# Patient Record
Sex: Female | Born: 1985 | Race: White | Hispanic: No | Marital: Married | Smoking: Current every day smoker
Health system: Southern US, Community
[De-identification: ages and names within clinical notes are randomized; demographics above are authoritative.]

## PROBLEM LIST (undated history)

## (undated) DIAGNOSIS — K219 Gastro-esophageal reflux disease without esophagitis: Secondary | ICD-10-CM

## (undated) DIAGNOSIS — M5136 Other intervertebral disc degeneration, lumbar region: Secondary | ICD-10-CM

## (undated) DIAGNOSIS — K851 Biliary acute pancreatitis without necrosis or infection: Secondary | ICD-10-CM

## (undated) DIAGNOSIS — M51369 Other intervertebral disc degeneration, lumbar region without mention of lumbar back pain or lower extremity pain: Secondary | ICD-10-CM

## (undated) HISTORY — DX: Other intervertebral disc degeneration, lumbar region: M51.36

## (undated) HISTORY — PX: TUBAL LIGATION: SHX77

## (undated) HISTORY — DX: Biliary acute pancreatitis without necrosis or infection: K85.10

## (undated) HISTORY — DX: Other intervertebral disc degeneration, lumbar region without mention of lumbar back pain or lower extremity pain: M51.369

---
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
## 2008-02-07 ENCOUNTER — Inpatient Hospital Stay (HOSPITAL_COMMUNITY): Admission: AD | Admit: 2008-02-07 | Discharge: 2008-02-07 | Payer: Self-pay | Admitting: Obstetrics and Gynecology

## 2008-05-21 ENCOUNTER — Inpatient Hospital Stay (HOSPITAL_COMMUNITY): Admission: AD | Admit: 2008-05-21 | Discharge: 2008-05-21 | Payer: Self-pay | Admitting: Obstetrics and Gynecology

## 2008-07-20 ENCOUNTER — Inpatient Hospital Stay (HOSPITAL_COMMUNITY): Admission: AD | Admit: 2008-07-20 | Discharge: 2008-07-20 | Payer: Self-pay | Admitting: Obstetrics & Gynecology

## 2008-07-21 ENCOUNTER — Inpatient Hospital Stay (HOSPITAL_COMMUNITY): Admission: AD | Admit: 2008-07-21 | Discharge: 2008-07-21 | Payer: Self-pay | Admitting: Obstetrics and Gynecology

## 2008-08-04 ENCOUNTER — Inpatient Hospital Stay (HOSPITAL_COMMUNITY): Admission: AD | Admit: 2008-08-04 | Discharge: 2008-08-04 | Payer: Self-pay | Admitting: Obstetrics and Gynecology

## 2008-08-18 ENCOUNTER — Inpatient Hospital Stay (HOSPITAL_COMMUNITY): Admission: AD | Admit: 2008-08-18 | Discharge: 2008-08-21 | Payer: Self-pay | Admitting: Obstetrics and Gynecology

## 2008-09-15 HISTORY — PX: TUBAL LIGATION: SHX77

## 2008-09-17 ENCOUNTER — Emergency Department (HOSPITAL_COMMUNITY): Admission: EM | Admit: 2008-09-17 | Discharge: 2008-09-17 | Payer: Self-pay | Admitting: Internal Medicine

## 2008-11-20 ENCOUNTER — Emergency Department (HOSPITAL_COMMUNITY): Admission: EM | Admit: 2008-11-20 | Discharge: 2008-11-20 | Payer: Self-pay | Admitting: Emergency Medicine

## 2008-11-22 ENCOUNTER — Emergency Department (HOSPITAL_COMMUNITY): Admission: EM | Admit: 2008-11-22 | Discharge: 2008-11-22 | Payer: Self-pay | Admitting: Emergency Medicine

## 2009-08-27 ENCOUNTER — Emergency Department (HOSPITAL_COMMUNITY): Admission: EM | Admit: 2009-08-27 | Discharge: 2009-08-27 | Payer: Self-pay | Admitting: Emergency Medicine

## 2009-08-29 ENCOUNTER — Emergency Department (HOSPITAL_COMMUNITY): Admission: EM | Admit: 2009-08-29 | Discharge: 2009-08-29 | Payer: Self-pay | Admitting: Emergency Medicine

## 2009-10-26 ENCOUNTER — Encounter: Admission: RE | Admit: 2009-10-26 | Discharge: 2009-10-26 | Payer: Self-pay | Admitting: Family Medicine

## 2010-02-01 ENCOUNTER — Emergency Department (HOSPITAL_COMMUNITY): Admission: EM | Admit: 2010-02-01 | Discharge: 2010-02-01 | Payer: Self-pay | Admitting: Emergency Medicine

## 2010-02-03 ENCOUNTER — Emergency Department (HOSPITAL_COMMUNITY): Admission: EM | Admit: 2010-02-03 | Discharge: 2010-02-03 | Payer: Self-pay | Admitting: Emergency Medicine

## 2010-03-08 ENCOUNTER — Ambulatory Visit (HOSPITAL_COMMUNITY): Admission: RE | Admit: 2010-03-08 | Discharge: 2010-03-08 | Payer: Self-pay | Admitting: Obstetrics and Gynecology

## 2010-04-19 ENCOUNTER — Encounter: Admission: RE | Admit: 2010-04-19 | Discharge: 2010-04-19 | Payer: Self-pay | Admitting: Obstetrics and Gynecology

## 2010-04-26 ENCOUNTER — Encounter: Admission: RE | Admit: 2010-04-26 | Discharge: 2010-04-26 | Payer: Self-pay | Admitting: Obstetrics and Gynecology

## 2010-05-17 ENCOUNTER — Ambulatory Visit (HOSPITAL_COMMUNITY): Admission: RE | Admit: 2010-05-17 | Discharge: 2010-05-17 | Payer: Self-pay | Admitting: Gastroenterology

## 2010-09-26 ENCOUNTER — Emergency Department (HOSPITAL_COMMUNITY)
Admission: EM | Admit: 2010-09-26 | Discharge: 2010-09-27 | Payer: Self-pay | Source: Home / Self Care | Admitting: Emergency Medicine

## 2010-09-30 LAB — COMPREHENSIVE METABOLIC PANEL
ALT: 13 U/L (ref 0–35)
AST: 17 U/L (ref 0–37)
Albumin: 4.3 g/dL (ref 3.5–5.2)
Alkaline Phosphatase: 67 U/L (ref 39–117)
BUN: 3 mg/dL — ABNORMAL LOW (ref 6–23)
CO2: 24 mEq/L (ref 19–32)
Calcium: 9.4 mg/dL (ref 8.4–10.5)
Chloride: 106 mEq/L (ref 96–112)
Creatinine, Ser: 0.69 mg/dL (ref 0.4–1.2)
GFR calc Af Amer: >60 mL/min (ref >60)
GFR calc non Af Amer: >60 mL/min (ref >60)
Glucose, Bld: 93 mg/dL (ref 70–99)
Potassium: 3.7 mEq/L (ref 3.5–5.1)
Sodium: 138 mEq/L (ref 135–145)
Total Bilirubin: 0.6 mg/dL (ref 0.3–1.2)
Total Protein: 7 g/dL (ref 6.0–8.3)

## 2010-09-30 LAB — URINALYSIS, ROUTINE W REFLEX MICROSCOPIC
Bilirubin Urine: NEGATIVE
Ketones, ur: NEGATIVE mg/dL
Nitrite: NEGATIVE
Protein, ur: NEGATIVE mg/dL
Specific Gravity, Urine: 1.009 (ref 1.005–1.030)
Urine Glucose, Fasting: NEGATIVE mg/dL
Urobilinogen, UA: 0.2 mg/dL (ref 0.0–1.0)
pH: 7 (ref 5.0–8.0)

## 2010-09-30 LAB — URINE MICROSCOPIC-ADD ON

## 2010-09-30 LAB — CBC
HCT: 39 % (ref 36.0–46.0)
Hemoglobin: 13.5 g/dL (ref 12.0–15.0)
MCH: 30 pg (ref 26.0–34.0)
MCHC: 34.6 g/dL (ref 30.0–36.0)
MCV: 86.7 fL (ref 78.0–100.0)
Platelets: 247 10*3/uL (ref 150–400)
RBC: 4.5 MIL/uL (ref 3.87–5.11)
RDW: 12.4 % (ref 11.5–15.5)
WBC: 6.2 10*3/uL (ref 4.0–10.5)

## 2010-09-30 LAB — DIFFERENTIAL
Basophils Absolute: 0 10*3/uL (ref 0.0–0.1)
Basophils Relative: 0 % (ref 0–1)
Eosinophils Absolute: 0.2 10*3/uL (ref 0.0–0.7)
Eosinophils Relative: 3 % (ref 0–5)
Lymphocytes Relative: 37 % (ref 12–46)
Lymphs Abs: 2.3 10*3/uL (ref 0.7–4.0)
Monocytes Absolute: 0.4 10*3/uL (ref 0.1–1.0)
Monocytes Relative: 7 % (ref 3–12)
Neutro Abs: 3.4 10*3/uL (ref 1.7–7.7)
Neutrophils Relative %: 54 % (ref 43–77)

## 2010-09-30 LAB — POCT PREGNANCY, URINE: Preg Test, Ur: NEGATIVE

## 2010-09-30 LAB — LIPASE, BLOOD: Lipase: 22 U/L (ref 11–59)

## 2010-12-01 LAB — HCG, QUANTITATIVE, PREGNANCY: hCG, Beta Chain, Quant, S: <2 m[IU]/mL (ref <5)

## 2010-12-01 LAB — CBC
HCT: 40.2 % (ref 36.0–46.0)
Hemoglobin: 13.9 g/dL (ref 12.0–15.0)
MCH: 30.9 pg (ref 26.0–34.0)
MCHC: 34.6 g/dL (ref 30.0–36.0)
MCV: 89.2 fL (ref 78.0–100.0)
Platelets: 221 10*3/uL (ref 150–400)
RBC: 4.51 MIL/uL (ref 3.87–5.11)
RDW: 13 % (ref 11.5–15.5)
WBC: 5.4 10*3/uL (ref 4.0–10.5)

## 2010-12-01 LAB — SURGICAL PCR SCREEN
MRSA, PCR: NEGATIVE
Staphylococcus aureus: POSITIVE — AB

## 2010-12-02 LAB — BASIC METABOLIC PANEL
BUN: 4 mg/dL — ABNORMAL LOW (ref 6–23)
CO2: 26 mEq/L (ref 19–32)
Calcium: 9.4 mg/dL (ref 8.4–10.5)
Chloride: 105 mEq/L (ref 96–112)
Creatinine, Ser: 0.66 mg/dL (ref 0.4–1.2)
GFR calc Af Amer: >60 mL/min (ref >60)
GFR calc non Af Amer: >60 mL/min (ref >60)
Glucose, Bld: 89 mg/dL (ref 70–99)
Potassium: 3.5 mEq/L (ref 3.5–5.1)
Sodium: 139 mEq/L (ref 135–145)

## 2010-12-02 LAB — DIFFERENTIAL
Basophils Absolute: 0 10*3/uL (ref 0.0–0.1)
Lymphocytes Relative: 18 % (ref 12–46)
Monocytes Absolute: 0.7 10*3/uL (ref 0.1–1.0)
Monocytes Relative: 7 % (ref 3–12)
Neutro Abs: 7.7 10*3/uL (ref 1.7–7.7)
Neutrophils Relative %: 74 % (ref 43–77)

## 2010-12-02 LAB — CBC
Hemoglobin: 14.4 g/dL (ref 12.0–15.0)
RBC: 4.66 MIL/uL (ref 3.87–5.11)
RDW: 12.4 % (ref 11.5–15.5)

## 2010-12-26 LAB — CULTURE, ROUTINE-ABSCESS: Gram Stain: NONE SEEN

## 2010-12-30 LAB — COMPREHENSIVE METABOLIC PANEL
ALT: 36 U/L — ABNORMAL HIGH (ref 0–35)
AST: 81 U/L — ABNORMAL HIGH (ref 0–37)
Alkaline Phosphatase: 121 U/L — ABNORMAL HIGH (ref 39–117)
CO2: 24 mEq/L (ref 19–32)
Calcium: 8.9 mg/dL (ref 8.4–10.5)
Chloride: 106 mEq/L (ref 96–112)
GFR calc Af Amer: >60 mL/min (ref >60)
GFR calc non Af Amer: >60 mL/min (ref >60)
Glucose, Bld: 119 mg/dL — ABNORMAL HIGH (ref 70–99)
Potassium: 3.5 mEq/L (ref 3.5–5.1)
Sodium: 138 mEq/L (ref 135–145)

## 2010-12-30 LAB — DIFFERENTIAL
Basophils Relative: 1 % (ref 0–1)
Eosinophils Absolute: 0.2 10*3/uL (ref 0.0–0.7)
Eosinophils Relative: 1 % (ref 0–5)
Lymphs Abs: 2.6 10*3/uL (ref 0.7–4.0)
Neutrophils Relative %: 74 % (ref 43–77)

## 2010-12-30 LAB — URINALYSIS, ROUTINE W REFLEX MICROSCOPIC
Bilirubin Urine: NEGATIVE
Hgb urine dipstick: NEGATIVE
Ketones, ur: NEGATIVE mg/dL
Nitrite: NEGATIVE
Specific Gravity, Urine: 1.015 (ref 1.005–1.030)
pH: 7 (ref 5.0–8.0)

## 2010-12-30 LAB — URINE MICROSCOPIC-ADD ON

## 2010-12-30 LAB — CBC
Hemoglobin: 12.4 g/dL (ref 12.0–15.0)
MCHC: 33.5 g/dL (ref 30.0–36.0)
RBC: 4.12 MIL/uL (ref 3.87–5.11)
WBC: 14.8 10*3/uL — ABNORMAL HIGH (ref 4.0–10.5)

## 2010-12-30 LAB — LIPASE, BLOOD: Lipase: 39 U/L (ref 11–59)

## 2011-01-23 ENCOUNTER — Emergency Department (HOSPITAL_COMMUNITY)
Admission: EM | Admit: 2011-01-23 | Discharge: 2011-01-24 | Disposition: A | Discharge status: Home / Self Care | Payer: Self-pay | Attending: Emergency Medicine | Admitting: Emergency Medicine

## 2011-01-23 ENCOUNTER — Emergency Department (HOSPITAL_COMMUNITY): Payer: Self-pay

## 2011-01-23 DIAGNOSIS — R5381 Other malaise: Secondary | ICD-10-CM | POA: Insufficient documentation

## 2011-01-23 DIAGNOSIS — R1031 Right lower quadrant pain: Secondary | ICD-10-CM | POA: Insufficient documentation

## 2011-01-23 DIAGNOSIS — R42 Dizziness and giddiness: Secondary | ICD-10-CM | POA: Insufficient documentation

## 2011-01-23 DIAGNOSIS — R079 Chest pain, unspecified: Secondary | ICD-10-CM | POA: Insufficient documentation

## 2011-01-23 DIAGNOSIS — R300 Dysuria: Secondary | ICD-10-CM | POA: Insufficient documentation

## 2011-01-23 DIAGNOSIS — R5383 Other fatigue: Secondary | ICD-10-CM | POA: Insufficient documentation

## 2011-01-23 LAB — URINALYSIS, ROUTINE W REFLEX MICROSCOPIC
Bilirubin Urine: NEGATIVE
Hgb urine dipstick: NEGATIVE
Ketones, ur: NEGATIVE mg/dL
Specific Gravity, Urine: 1.009 (ref 1.005–1.030)
Urobilinogen, UA: 0.2 mg/dL (ref 0.0–1.0)
pH: 7 (ref 5.0–8.0)

## 2011-01-23 LAB — CBC
HCT: 39.7 % (ref 36.0–46.0)
Hemoglobin: 13.7 g/dL (ref 12.0–15.0)
MCH: 29.5 pg (ref 26.0–34.0)
MCHC: 34.5 g/dL (ref 30.0–36.0)
RDW: 12.4 % (ref 11.5–15.5)

## 2011-01-23 LAB — DIFFERENTIAL
Eosinophils Relative: 1 % (ref 0–5)
Lymphocytes Relative: 35 % (ref 12–46)
Monocytes Absolute: 0.5 10*3/uL (ref 0.1–1.0)
Monocytes Relative: 8 % (ref 3–12)
Neutro Abs: 3.4 10*3/uL (ref 1.7–7.7)

## 2011-01-23 LAB — POCT PREGNANCY, URINE: Preg Test, Ur: NEGATIVE

## 2011-01-24 LAB — BASIC METABOLIC PANEL
BUN: 5 mg/dL — ABNORMAL LOW (ref 6–23)
CO2: 25 mEq/L (ref 19–32)
Calcium: 9.4 mg/dL (ref 8.4–10.5)
Creatinine, Ser: 0.56 mg/dL (ref 0.4–1.2)
Glucose, Bld: 102 mg/dL — ABNORMAL HIGH (ref 70–99)

## 2011-01-28 NOTE — H&P (Signed)
NAME:  Molly Beard, Molly Beard NO.:  [id number]   MEDICAL RECORD NO.:  [id number]         PATIENT TYPE:  WINP   LOCATION:                                FACILITY:  [facility]   PHYSICIAN:  Osborn Coho, M.D.   DATE OF BIRTH:  1986-03-23   DATE OF ADMISSION:  08/18/2008  DATE OF DISCHARGE:                              HISTORY & PHYSICAL   Molly Beard is a 25 year old gravida 2, para 1-0-0-1 at 40-6/7 weeks who  presents tonight for induction with initial cervical ripening.  Her  pregnancy has been remarkable for:  1. First trimester spotting.  2. The patient is a smoker.  3. History of depression.  4. 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  5. Positive group B strep.   PRENATAL LABORATORY DATA:  Blood type is O+, Rh antibody negative, VDRL  nonreactive, rubella titer positive, hepatitis B surface antigen  negative, HIV is nonreactive.  Cystic fibrosis testing shows positive  carrier trait.  HIV is nonreactive.  Glucola is normal.  Pap was normal  in May.  GC and chlamydia cultures were negative in May.  Hemoglobin  upon entering the practice was 13.5.  It was 10.5 at 28 weeks.  The  patient had a first trimester screen ultrasound that was within normal  limits.  Group B strep culture was noted to be positive at 36 weeks.   HISTORY OF PRESENT PREGNANCY:  The patient entered care at approximately  10 weeks.  She had an ultrasound at that time that showed a viable  intrauterine pregnancy with an [facility] of August 14, 2008.  Her EDC by LMP  was August 12, 2008.  She did have some first trimester spotting.  She  did have first trimester screen that was normal.  She did have a history  of positive cystic fibrosis screen, but they declined partner treatment  secondary to cost.  She had some UTI symptoms and was seen in the  maternity admissions in May.  Another urine culture was sent.  The  patient was  placed on Bactrim.  She was also treated for BV.  She was  given a dental referral.  She completed that antibiotic by 18 weeks.  She had a normal ultrasound at that time with normal fluid.  She was  having a questionable ganglion cyst in the left arm.  UTI symptoms were  resolved by 22 weeks.  She had a Glucola that was normal.  Her iron  level was slightly low at 26 weeks at 10.5.  She was recommended to take  iron supplementation.  She was seen in maternity admissions unit at 36  weeks for nausea and vomiting secondary to questionable viral exposure.  She received some IV fluids and antiemetics.  She still was having  vomiting the next day.  She was sent back to maternity admissions unit  for fluid secondary to 2+ ketones.  She was treated for yeast at 38  weeks.  At 39 weeks, cervix was  1, 70% vertex, -2.  Positive beta strep  was noted.  She was seen on December 3 for a series of contractions.  However, these had subsided by the time she had came to the office, and  NST was overall reassuring but was not clearly reactive.  Cervix was 1,  posterior, 60%, vertex at -1 station.  A BPP was done showing an 8/8  score with fluid and AFI of 7.54 cm at the 10th percentile and vertex  presentation.  Dr. Mikey Bussing was consulted.  The decision was made that  the patient did not require admission on that day, but the plan was made  to admit her for cervical ripening and induction at the next available  slot.  This was then able to be accomplished on the evening of December  4 at 7:30.   OBSTETRICAL HISTORY:  In 2006, she had a vaginal birth of a female infant,  weight 5 pounds 8 ounces at 40 weeks.  She was in labor 21 hours.  She  had epidural anesthesia.  She did have beta strep with that pregnancy as  well.   MEDICAL HISTORY:  She has been a smoker since age 25, approximately half  a pack per day.  Her only other hospitalization was for childbirth.  She  has no surgical history.   She has no  medication allergies.   FAMILY HISTORY:  Her mother and sister have hypertension.  Her paternal  grandfather had lung cancer.  Maternal grandfather had some unknown type  of cancer.  Her mother, sister and father all smoked.   GENETIC HISTORY:  __________   SOCIAL HISTORY:  The patient is single.  The father of baby is involved  and supportive.  His name is Chandra Batch.  The patient has an 8th grade  education.  She is a Futures trader.  Her partner has an 11th grade  education.  He is a Administrator.  She has been followed by the certified  nurse midwife service at [facility].  She denies any alcohol,  drug or tobacco use during this pregnancy.   PHYSICAL EXAMINATION:  VITAL SIGNS:  Stable.  The patient is febrile.  HEENT:  Within normal limits.  LUNGS:  Breath sounds are clear.  HEART:  Regular rate and rhythm without murmur.  BREASTS:  Soft and nontender.  ABDOMEN:  Fundal height is approximately 38 cm.  Estimated fetal weight  is 7 to 7-1/2 pounds.  Uterine contractions are very occasional and  mild.  Fetal heart rate is reassuring.  BPP shows an 8/8 with an AFI of  7.54 cm which is the 10th percentile.  Cervix is posterior, 1 cm, 60%,  vertex at a -1 station.  Membranes were attempted to be swept, but this  was unable to be accomplished without significant pain on the patient's  part.  EXTREMITIES:  Deep tendon reflexes are 2+ without clonus.  There is  trace edema noted.   IMPRESSION:  1. Intrauterine pregnancy at 40-6/7 weeks.  2. Unfavorable cervix.  3. Positive group B strep.   PLAN:  1. Admit to birthing suite per consult with Dr. Osborn Coho as      attending physician.  2. Routine certified nurse midwife orders.  3. Plan cervical ripening anticipated with Cytotec this evening and      then Pitocin in the morning.  4. Will initiate group B strep prophylaxis with penicillin G per      standard dosing once Pitocin is begun  or once labor ensues.  5. Pain  medication p.r.n.      Molly Beard, C.N.M.      Osborn Coho, M.D.  Electronically Signed    VLL/MEDQ  D:  08/18/2008  T:  08/18/2008  Job:  [id number]

## 2011-01-31 NOTE — H&P (Signed)
[facility]  Patient:    Molly Beard, NISHIDA Visit Number: [id number] MRN: [id number]          Service Type: PSY Location: 100 0103 02 Attending Physician:  Veneta Penton Dictated by:   Jasmine Pang, M.D. Admit Date:  07/16/2001   CC:         [facility].   Psychiatric Admission Assessment  DATE OF ADMISSION:  July 17, 2001.  IDENTIFICATION:  A 25 year old Caucasian female from [facility] referred by the [facility] due to suicidal ideation.  HISTORY OF PRESENT ILLNESS:  The patient was referred due to suicidal ideation with auditory command hallucinations to harm herself.  She reports conflict with her mother revolving around the fact that she was not going to school. Mother apparently called the police to have her committed due to her escalating compensation and auditory hallucinations.  She admits to experiencing depressed, irritability, anergia, anhedonia, and difficulty concentrating.  She is also experiencing insomnia and feelings of hopelessness and worthlessness.  She has had auditory hallucinations for the past year which command her to kill herself.  She tries to block these hallucinations out but states recently they have begun to interfere with her functioning, including going to school.  She admits to being very frightened by them.  The patient was admitted to the hospital due to dangerousness to self and need for a more controlled environment.  PAST PSYCHIATRIC HISTORY:  None.  SUBSTANCE ABUSE HISTORY:  None.  PAST MEDICAL HISTORY:  No acute health problems.  ALLERGIES:  No known drug allergies.  CURRENT MEDICATIONS:  None.  FAMILY/SOCIAL HISTORY:  The patient lives with her mother, brother, and sister. She admits to avoiding school recently, resulting in a drop in her academic performance.  She denies any history of physical or sexual abuse.  FAMILY PSYCHIATRIC  HISTORY:  Positive for her sister suffering from depression.  Her sister was hospitalized at [facility].  The patient has no legal problems.  MENTAL STATUS EXAMINATION:  The patient presented as a reserved but cooperative Caucasian female with poor eye contact.  She has psychomotor retardation.  Speech was soft and slow.  There was no articulation disorder. Mood depressed, affect sad, constricted.  Positive suicidal ideation  as per history of present illness.  She is able to contract for safety in the hospital.  There is no homicidal ideation.  Positive psychosis with auditory hallucinations telling to harm herself, as per history of present illness. She denies active hallucinations during this exam.  She denies visual hallucinations.  Thought processes were logical and goal directed.  Thought content:  No predominant theme.  On cognitive exam, patient was alert and oriented x 4.  Short term and long term memory were adequate.  General fund of knowledge were age and education level appropriate.  Attention and concentration poor, insight poor, judgment poor.  ADMISSION DIAGNOSES: Axis I:    1. Depressive disorder not otherwise specified.            2. Psychotic disorder not otherwise specified. Axis II:   Deferred. Axis III:  Healthy. Axis IV:   Severe. Axis V:    Global assessment of function is 20.  PROBLEMS:  Mood instability with psychosis with auditory hallucinations commanding threats to harm self.  SHORT TERM TREATMENT GOAL:  Resolution of psychosis and threats to harm self.  LONG TERM TREATMENT GOAL:  Resolution of mood instability and psychotic processes.  INITIAL PLAN OF CARE:  Begin Geodon 20 mg p.o. b.i.d. and Effexor XR 37.5 mg p.o. q.a.m.  The patient will be involved in unit therapeutic groups and activities.  The patient will be involved in family therapy.  ESTIMATED LENGTH OF INPATIENT TREATMENT:  Five to seven days.  CONDITION  NECESSARY FOR DISCHARGE:  Less depressed, not suicidal, resolution of hallucinations.  POST HOSPITAL CARE PLAN:  Return home to live with family.  Followup therapy and medication management will be at the [facility]. Dictated by:   Jasmine Pang, M.D. Attending Physician:  Veneta Penton DD:  07/17/01 TD:  07/19/01 Job: [id number]

## 2011-01-31 NOTE — Discharge Summary (Signed)
[facility]  Patient:    Molly Beard, Molly Beard Visit Number: [id number] MRN: [id number]          Service Type: PSY Location: 100 0103 02 Attending Physician:  Veneta Penton Dictated by:   Carolanne Grumbling, M.D. Admit Date:  07/16/2001 Discharge Date: 07/26/2001                             Discharge Summary  INTRODUCTION:  Augusto Gamble was a 25 year old female.  INITIAL ASSESSMENT AND DIAGNOSES:  Augusto Gamble was admitted to the service of Dr. Haynes Hoehn, who was her attending, until the weekend of discharge, when I was on-call.  Augusto Gamble was admitted because of suicidal ideation.  She, reportedly, also had auditory hallucinations telling her to harm herself.  She reported conflict with her mother around school issues and not attending.  She admitted to depression with the various symptoms supporting that diagnosis.  MENTAL STATUS:  At the time of the initial evaluation revealed an alert, oriented young woman, who was basically cooperative.  She was quiet and speech was soft and slow.  She appeared depressed and sad with constricted affect. She was positive for suicidal ideation per history.  She was able to contract for safety at the time of admission.  She did admit to auditory hallucinations telling her to harm herself and others at times.  She denied any hallucinations at the time of the mental status.  There were no other problems to suggest psychosis.  Short and long-term memory were intact.  Insight was poor.  Judgment was poor.  Concentration was poor.  Other pertinent history can be obtained from the psychosocial service summary.  PHYSICAL EXAMINATION:  Essentially normal.  ADMITTING DIAGNOSES: Axis I:    1. Depressive disorder not otherwise specified.            2. Psychotic disorder not otherwise specified. Axis II:   Deferred. Axis III:  Healthy. Axis IV:   Severe. Axis V:    20.  LABORATORY DATA:  Noncontributory to the situation.  HOSPITAL COURSE:   While in the hospital, Jody initially was unhappy in being here, believed she did not need to be in the hospital, bargained with her mother to get her out and, at some point in the first few days, she seemed to accept the idea that she had to be here and she began to do some talking and some work in groups.  She also met with her mother every day while she was here.  The initial plan was that she would go live with her father.  Mother was not thrilled with the idea but said it simply was not working out at home and she had to live somewhere else.  At some point during the hospitalization, Jody and her father had words.  Jody decided that she could not live with the father.  She said her father and stepmother have their own set of problems. Her stepmother was also a drinker and that was a problem.  It appeared they had disagreements over Jody coming to live with them and that caused a breakup in the marriage.  Jody said that was not uncommon but, nevertheless, she said, based on all the strife there, it made little sense to live with her father. She believes she could put up with her mothers problems more than she could the problems she would have dealing with her father and stepmother. Consequently, her father apparently changed  his mind and said he would not pick her up at discharge nor take her after discharge.  Mother continued to be involved and said she would continue to work with her.  By this point, Augusto Gamble was not suicidal.  She was feeling much better about herself, she said.  She was feeling that she could find other ways of dealing with her anger and irritation.  She and her mother believe they could work together to improve things somewhat.  She seemed at least somewhat willing to go to school. Consequently, all the issues that were problematic at least had an answer at the time of discharge.  Because of her continued denial of suicidal ideation, she was discharged home to her  mother.  DISCHARGE MEDICATIONS: 1. Effexor XR 75 mg daily. 2. Geodon 120 mg at bedtime.  FINAL DIAGNOSES: Axis I:    1. Major depressive disorder, recurrent, severe with psychotic               features.            2. Oppositional defiant disorder. Axis II:   Deferred. Axis III:  Healthy. Axis IV:   Severe. Axis V:    50. Dictated by:   Carolanne Grumbling, M.D. Attending Physician:  Veneta Penton DD:  07/29/01 TD:  07/29/01 Job: [id number]

## 2011-01-31 NOTE — H&P (Signed)
NAME:  Molly Beard, EHLER                 ACCOUNT NO.:  [id number]   MEDICAL RECORD NO.:  [id number]          PATIENT TYPE:  INP   LOCATION:  9167                          FACILITY:  [facility]   PHYSICIAN:  Crist Fat. Rivard, M.D. DATE OF BIRTH:  Jun 13, 1986   DATE OF ADMISSION:  01/24/2005  DATE OF DISCHARGE:                                HISTORY & PHYSICAL   Molly Beard is a 25 year old, single, white female, gravida 1, para 0, at  15.5 weeks' gestation who presents complaining of uterine contractions every  2-3 minutes, all late afternoon and early evening.  She denies any leaking  or vaginal bleeding.  She is very uncomfortable with the pressure of the  contractions.  She denies any nausea, vomiting, headaches, or visual  disturbances.   Her pregnancy has been followed at [facility]  by the certified  nurse midwife service and has been essentially uncomplicated though at risk  for:  1.  Being an adolescent.  2.  Questionable LMP.  3.  Smoker.  4.  Positive Group B strep.   She desires epidural for labor.   OBSTETRICAL/GYNECOLOGIC HISTORY:  1.  She is a gravida 1, para 0, with a questionable LMP and EDC established      by early ultrasound of Jan 29, 2005.  2.  She has no other GYN history.   GENERAL MEDICAL HISTORY:  1.  She has no known drug allergies.  2.  She reports having had the usual childhood diseases.  3.  She has no other medical issues.   FAMILY HISTORY:  Significant only for maternal grandfather with lung cancer.   GENETIC HISTORY:  Negative.   SOCIAL HISTORY:  She is single.  She lives with her mother.  The father of  the baby and the mother of the father of the baby are very involved and  supportive.  She denies any religious affiliation that would affect her  care.  She is currently unemployed.   PRENATAL LABS:  Her blood type is O positive.  Her antibody screen is  negative.  Syphilis is nonreactive.  Rubella is positive.  Hepatitis B  surface  antigen is negative.  HIV is nonreactive.  GC and Chlamydia and Pap  are negative.  She is positive for the cystic fibrosis trait and the father  of the baby was recommended to be tested, however, the father never did get  tested.   PHYSICAL EXAMINATION:  VITAL SIGNS:  Stable.  She is afebrile.  HEENT:  Grossly within normal limits.  HEART:  Regular rhythm and rate.  CHEST:  Clear.  BREASTS:  Soft and nontender.  ABDOMEN:  Gravid.  Uterine contractions are every 2-3 minutes.  Fetal heart  rate is reactive and reassuring.  PELVIC:  Was 3- to -4-cm, 80%, vertex, and -1, and had previously been 1- to  -2-cm when she first presented.  EXTREMITIES:  Within normal limits.   ASSESSMENT:  1.  Intrauterine pregnancy at 39.5 weeks' gestation.  2.  Positive Group B strep.  3.  Early labor.   PLAN:  1.  Admit to labor and delivery.  2.  Follow routine CNM orders.  3.  Give her an epidural for labor.  4.  Penicillin for Group B strep prophylaxis.  5.  Notify Dr. Estanislado Pandy of the patient's admission.      SJD/MEDQ  D:  01/25/2005  T:  01/25/2005  Job:  [id number]

## 2011-06-11 LAB — URINE CULTURE

## 2011-06-11 LAB — WET PREP, GENITAL
Trich, Wet Prep: NONE SEEN
Yeast Wet Prep HPF POC: NONE SEEN

## 2011-06-11 LAB — URINALYSIS, ROUTINE W REFLEX MICROSCOPIC
Ketones, ur: NEGATIVE
Protein, ur: 30 — AB
Urobilinogen, UA: 4 — ABNORMAL HIGH

## 2011-06-11 LAB — URINE MICROSCOPIC-ADD ON

## 2011-06-11 LAB — GC/CHLAMYDIA PROBE AMP, GENITAL
Chlamydia, DNA Probe: NEGATIVE
GC Probe Amp, Genital: NEGATIVE

## 2011-06-17 LAB — URINALYSIS, ROUTINE W REFLEX MICROSCOPIC
Bilirubin Urine: NEGATIVE
Glucose, UA: NEGATIVE
Hgb urine dipstick: NEGATIVE
Ketones, ur: >80 — AB
Ketones, ur: NEGATIVE
Nitrite: NEGATIVE
Protein, ur: NEGATIVE
pH: 7
pH: 7

## 2011-06-17 LAB — URINE CULTURE: Colony Count: 8000

## 2011-06-17 LAB — URINE MICROSCOPIC-ADD ON

## 2011-06-18 LAB — URINE MICROSCOPIC-ADD ON: RBC / HPF: NONE SEEN

## 2011-06-18 LAB — URINALYSIS, ROUTINE W REFLEX MICROSCOPIC
Bilirubin Urine: NEGATIVE
Hgb urine dipstick: NEGATIVE
Specific Gravity, Urine: <1.005 — ABNORMAL LOW
Urobilinogen, UA: 0.2

## 2011-06-20 LAB — COMPREHENSIVE METABOLIC PANEL
ALT: 9 U/L (ref 0–35)
Alkaline Phosphatase: 148 U/L — ABNORMAL HIGH (ref 39–117)
CO2: 22 mEq/L (ref 19–32)
Chloride: 105 mEq/L (ref 96–112)
GFR calc non Af Amer: >60 mL/min (ref >60)
Glucose, Bld: 105 mg/dL — ABNORMAL HIGH (ref 70–99)
Potassium: 3.4 mEq/L — ABNORMAL LOW (ref 3.5–5.1)
Sodium: 135 mEq/L (ref 135–145)
Total Bilirubin: 0.5 mg/dL (ref 0.3–1.2)

## 2011-06-20 LAB — CBC
Hemoglobin: 11.6 g/dL — ABNORMAL LOW (ref 12.0–15.0)
MCHC: 34.7 g/dL (ref 30.0–36.0)
MCV: 91.3 fL (ref 78.0–100.0)
Platelets: 215 10*3/uL (ref 150–400)
RBC: 3.67 MIL/uL — ABNORMAL LOW (ref 3.87–5.11)
RBC: 3.71 MIL/uL — ABNORMAL LOW (ref 3.87–5.11)
RDW: 12.3 % (ref 11.5–15.5)
WBC: 8.8 10*3/uL (ref 4.0–10.5)
WBC: 9.4 10*3/uL (ref 4.0–10.5)
WBC: 10.6 10*3/uL — ABNORMAL HIGH (ref 4.0–10.5)

## 2011-06-20 LAB — URINALYSIS, DIPSTICK ONLY
Ketones, ur: NEGATIVE mg/dL
Protein, ur: NEGATIVE mg/dL
Urobilinogen, UA: 0.2 mg/dL (ref 0.0–1.0)

## 2011-06-20 LAB — RPR: RPR Ser Ql: NONREACTIVE

## 2011-06-20 LAB — AMYLASE: Amylase: 51 U/L (ref 27–131)

## 2011-06-20 LAB — LIPASE, BLOOD: Lipase: 27 U/L (ref 11–59)

## 2011-11-25 ENCOUNTER — Emergency Department (HOSPITAL_COMMUNITY): Payer: Self-pay

## 2011-11-25 ENCOUNTER — Encounter (HOSPITAL_COMMUNITY): Payer: Self-pay | Admitting: *Deleted

## 2011-11-25 ENCOUNTER — Emergency Department (HOSPITAL_COMMUNITY)
Admission: EM | Admit: 2011-11-25 | Discharge: 2011-11-25 | Disposition: A | Discharge status: Home / Self Care | Payer: Self-pay | Attending: Emergency Medicine | Admitting: Emergency Medicine

## 2011-11-25 DIAGNOSIS — R110 Nausea: Secondary | ICD-10-CM | POA: Insufficient documentation

## 2011-11-25 DIAGNOSIS — F17200 Nicotine dependence, unspecified, uncomplicated: Secondary | ICD-10-CM | POA: Insufficient documentation

## 2011-11-25 DIAGNOSIS — N644 Mastodynia: Secondary | ICD-10-CM | POA: Insufficient documentation

## 2011-11-25 DIAGNOSIS — R109 Unspecified abdominal pain: Secondary | ICD-10-CM | POA: Insufficient documentation

## 2011-11-25 LAB — COMPREHENSIVE METABOLIC PANEL
ALT: 8 U/L (ref 0–35)
AST: 11 U/L (ref 0–37)
Albumin: 3.9 g/dL (ref 3.5–5.2)
Alkaline Phosphatase: 67 U/L (ref 39–117)
BUN: 5 mg/dL — ABNORMAL LOW (ref 6–23)
Chloride: 106 mEq/L (ref 96–112)
Potassium: 3.5 mEq/L (ref 3.5–5.1)
Sodium: 139 mEq/L (ref 135–145)
Total Bilirubin: 0.6 mg/dL (ref 0.3–1.2)
Total Protein: 7 g/dL (ref 6.0–8.3)

## 2011-11-25 LAB — DIFFERENTIAL
Basophils Absolute: 0 10*3/uL (ref 0.0–0.1)
Basophils Relative: 0 % (ref 0–1)
Eosinophils Absolute: 0.6 10*3/uL (ref 0.0–0.7)
Monocytes Relative: 10 % (ref 3–12)
Neutro Abs: 5.1 10*3/uL (ref 1.7–7.7)
Neutrophils Relative %: 62 % (ref 43–77)

## 2011-11-25 LAB — CBC
Hemoglobin: 12.5 g/dL (ref 12.0–15.0)
MCH: 29.6 pg (ref 26.0–34.0)
MCHC: 34.2 g/dL (ref 30.0–36.0)
Platelets: 234 10*3/uL (ref 150–400)
RBC: 4.22 MIL/uL (ref 3.87–5.11)

## 2011-11-25 LAB — URINALYSIS, ROUTINE W REFLEX MICROSCOPIC
Ketones, ur: NEGATIVE mg/dL
Leukocytes, UA: NEGATIVE
Nitrite: NEGATIVE
Specific Gravity, Urine: 1.012 (ref 1.005–1.030)
Urobilinogen, UA: 0.2 mg/dL (ref 0.0–1.0)
pH: 7.5 (ref 5.0–8.0)

## 2011-11-25 LAB — URINE MICROSCOPIC-ADD ON

## 2011-11-25 LAB — POCT PREGNANCY, URINE: Preg Test, Ur: NEGATIVE

## 2011-11-25 MED ORDER — NAPROXEN 500 MG PO TABS: 500.0000 mg | ORAL_TABLET | Freq: Two times a day (BID) | ORAL | Status: DC

## 2011-11-25 MED ORDER — KETOROLAC TROMETHAMINE 30 MG/ML IJ SOLN
30.0000 mg | Freq: Once | INTRAMUSCULAR | Status: AC
  Administered 2011-11-25: 30 mg via INTRAMUSCULAR
  Filled 2011-11-25: qty 1

## 2011-11-25 MED ORDER — OXYCODONE-ACETAMINOPHEN 5-325 MG PO TABS: 1.0000 | ORAL_TABLET | Freq: Once | ORAL | Status: AC

## 2011-11-25 MED ORDER — OXYCODONE-ACETAMINOPHEN 5-325 MG PO TABS
1.0000 | ORAL_TABLET | Freq: Once | ORAL | Status: AC
  Administered 2011-11-25: 1 via ORAL
  Filled 2011-11-25: qty 1

## 2011-11-25 NOTE — Discharge Instructions (Signed)
Abdominal Pain (Nonspecific) Your exam might not show the exact reason you have abdominal pain. Since there are many different causes of abdominal pain, another checkup and more tests may be needed. It is very important to follow up for lasting (persistent) or worsening symptoms. A possible cause of abdominal pain in any person who still has his or her appendix is acute appendicitis. Appendicitis is often hard to diagnose. Normal blood tests, urine tests, ultrasound, and CT scans do not completely rule out early appendicitis or other causes of abdominal pain. Sometimes, only the changes that happen over time will allow appendicitis and other causes of abdominal pain to be determined. Other potential problems that may require surgery may also take time to become more apparent. Because of this, it is important that you follow all of the instructions below. HOME CARE INSTRUCTIONS   Rest as much as possible.   Do not eat solid food until your pain is gone.   While adults or children have pain: A diet of water, weak decaffeinated tea, broth or bouillon, gelatin, oral rehydration solutions (ORS), frozen ice pops, or ice chips may be helpful.   When pain is gone in adults or children: Start a light diet (dry toast, crackers, applesauce, or white rice). Increase the diet slowly as long as it does not bother you. Eat no dairy products (including cheese and eggs) and no spicy, fatty, fried, or high-fiber foods.   Use no alcohol, caffeine, or cigarettes.   Take your regular medicines unless your caregiver told you not to.   Take any prescribed medicine as directed.   Only take over-the-counter or prescription medicines for pain, discomfort, or fever as directed by your caregiver. Do not give aspirin to children.  If your caregiver has given you a follow-up appointment, it is very important to keep that appointment. Not keeping the appointment could result in a permanent injury and/or lasting (chronic) pain  and/or disability. If there is any problem keeping the appointment, you must call to reschedule.  SEEK IMMEDIATE MEDICAL CARE IF:   Your pain is not gone in 24 hours.   Your pain becomes worse, changes location, or feels different.   You or your child has an oral temperature above 102 F (38.9 C), not controlled by medicine.   Your baby is older than 3 months with a rectal temperature of 102 F (38.9 C) or higher.   Your baby is 107 months old or younger with a rectal temperature of 100.4 F (38 C) or higher.   You have shaking chills.   You keep throwing up (vomiting) or cannot drink liquids.   There is blood in your vomit or you see blood in your bowel movements.   Your bowel movements become dark or black.   You have frequent bowel movements.   Your bowel movements stop (become blocked) or you cannot pass gas.   You have bloody, frequent, or painful urination.   You have yellow discoloration in the skin or whites of the eyes.   Your stomach becomes bloated or bigger.   You have dizziness or fainting.   You have chest or back pain.  MAKE SURE YOU:   Understand these instructions.   Will watch your condition.   Will get help right away if you are not doing well or get worse.  Document Released: 09/01/2005 Document Revised: 08/21/2011 Document Reviewed: 07/30/2009 [facility] Patient Information 2012 [place], [place].  Abdominal (belly) pain can be caused by many things. Your caregiver decides  the seriousness of your pain by an examination and possibly blood/urine tests and imaging (CT scan, x-rays, ultrasound). Many cases can be observed and treated at home after initial evaluation in the emergency department. Even though you are being discharged home, abdominal pain can be unpredictable. Therefore, you need a repeated exam if your pain does not resolve, returns, or worsens. Most patients with abdominal pain don't have to be admitted to the hospital or have surgery, but  serious problems like appendicitis and gallbladder attacks can start out as nonspecific pain. Many abdominal conditions cannot be diagnosed in one visit, so follow-up evaluations are very important. SEEK IMMEDIATE MEDICAL ATTENTION IF: The pain does not go away or becomes severe.  A temperature above 101 develops.  Repeated vomiting occurs (multiple episodes).  The pain becomes localized to portions of the abdomen. The right side could possibly be appendicitis. In an adult, the left lower portion of the abdomen could be colitis or diverticulitis.  Blood is being passed in stools or vomit (bright red or black tarry stools).  Return also if you develop chest pain, difficulty breathing, dizziness or fainting, or become confused, poorly responsive, or inconsolable (young children).    Acetaminophen; Oxycodone tablets What is this medicine? ACETAMINOPHEN; OXYCODONE (a set a MEE noe fen; ox i KOE done) is a pain reliever. It is used to treat mild to moderate pain. This medicine may be used for other purposes; ask your health care provider or pharmacist if you have questions. What should I tell my health care provider before I take this medicine? They need to know if you have any of these conditions: -brain tumor -Crohn's disease, inflammatory bowel disease, or ulcerative colitis -drink more than 3 alcohol containing drinks per day -drug abuse or addiction -head injury -heart or circulation problems -kidney disease or problems going to the bathroom -liver disease -lung disease, asthma, or breathing problems -an unusual or allergic reaction to acetaminophen, oxycodone, other opioid analgesics, other medicines, foods, dyes, or preservatives -pregnant or trying to get pregnant -breast-feeding How should I use this medicine? Take this medicine by mouth with a full glass of water. Follow the directions on the prescription label. Take your medicine at regular intervals. Do not take your medicine  more often than directed. Talk to your pediatrician regarding the use of this medicine in children. Special care may be needed. Patients over 50 years old may have a stronger reaction and need a smaller dose. Overdosage: If you think you have taken too much of this medicine contact a poison control center or emergency room at once. NOTE: This medicine is only for you. Do not share this medicine with others. What if I miss a dose? If you miss a dose, take it as soon as you can. If it is almost time for your next dose, take only that dose. Do not take double or extra doses. What may interact with this medicine? -alcohol or medicines that contain alcohol -antihistamines -barbiturates like amobarbital, butalbital, butabarbital, methohexital, pentobarbital, phenobarbital, thiopental, and secobarbital -benztropine -drugs for bladder problems like solifenacin, trospium, oxybutynin, tolterodine, hyoscyamine, and methscopolamine -drugs for breathing problems like ipratropium and tiotropium -drugs for certain stomach or intestine problems like propantheline, homatropine methylbromide, glycopyrrolate, atropine, belladonna, and dicyclomine -general anesthetics like etomidate, ketamine, nitrous oxide, propofol, desflurane, enflurane, halothane, isoflurane, and sevoflurane -medicines for depression, anxiety, or psychotic disturbances -medicines for pain like codeine, morphine, pentazocine, buprenorphine, butorphanol, nalbuphine, tramadol, and propoxyphene -medicines for sleep -muscle relaxants -naltrexone -phenothiazines like perphenazine, thioridazine,  chlorpromazine, mesoridazine, fluphenazine, prochlorperazine, promazine, and trifluoperazine -scopolamine -trihexyphenidyl This list may not describe all possible interactions. Give your health care provider a list of all the medicines, herbs, non-prescription drugs, or dietary supplements you use. Also tell them if you smoke, drink alcohol, or use illegal  drugs. Some items may interact with your medicine. What should I watch for while using this medicine? Tell your doctor or health care professional if your pain does not go away, if it gets worse, or if you have new or a different type of pain. You may develop tolerance to the medicine. Tolerance means that you will need a higher dose of the medication for pain relief. Tolerance is normal and is expected if you take this medicine for a long time. Do not suddenly stop taking your medicine because you may develop a severe reaction. Your body becomes used to the medicine. This does NOT mean you are addicted. Addiction is a behavior related to getting and using a drug for a nonmedical reason. If you have pain, you have a medical reason to take pain medicine. Your doctor will tell you how much medicine to take. If your doctor wants you to stop the medicine, the dose will be slowly lowered over time to avoid any side effects. You may get drowsy or dizzy. Do not drive, use machinery, or do anything that needs mental alertness until you know how this medicine affects you. Do not stand or sit up quickly, especially if you are an older patient. This reduces the risk of dizzy or fainting spells. Alcohol may interfere with the effect of this medicine. Avoid alcoholic drinks. The medicine will cause constipation. Try to have a bowel movement at least every 2 to 3 days. If you do not have a bowel movement for 3 days, call your doctor or health care professional. Do not take Tylenol (acetaminophen) or medicines that have acetaminophen with this medicine. Too much acetaminophen can be very dangerous. Many nonprescription medicines contain acetaminophen. Always read the labels carefully to avoid taking more acetaminophen. What side effects may I notice from receiving this medicine? Side effects that you should report to your doctor or health care professional as soon as possible: -allergic reactions like skin rash, itching  or hives, swelling of the face, lips, or tongue -breathing difficulties, wheezing -confusion -light headedness or fainting spells -severe stomach pain -yellowing of the skin or the whites of the eyes Side effects that usually do not require medical attention (report to your doctor or health care professional if they continue or are bothersome): -dizziness -drowsiness -nausea -vomiting This list may not describe all possible side effects. Call your doctor for medical advice about side effects. You may report side effects to FDA at 1-800-FDA-1088. Where should I keep my medicine? Keep out of the reach of children. This medicine can be abused. Keep your medicine in a safe place to protect it from theft. Do not share this medicine with anyone. Selling or giving away this medicine is dangerous and against the law. Store at room temperature between 20 and 25 degrees C (68 and 77 degrees F). Keep container tightly closed. Protect from light. Flush any unused medicines down the toilet. Do not use the medicine after the expiration date. NOTE: This sheet is a summary. It may not cover all possible information. If you have questions about this medicine, talk to your doctor, pharmacist, or health care provider.  2012, [employer]. (07/31/2008 10:01:21 AM)

## 2011-11-25 NOTE — ED Provider Notes (Signed)
History     CSN: [id number]  Arrival date & time 11/25/11  1355   First MD Initiated Contact with Patient 11/25/11 1507      Chief Complaint  Patient presents with  . Breast Pain  . Abdominal Pain   This is a 26 year old female with no significant past medical history other than a previous tubal ligation. She began having pain in her right flank 2 days ago. This has become increasingly persistent and painful. She's had some nausea, but no vomiting. She does state she has a history of chronic pelvic pain, which is unchanged. She does have a new pain, which includes sharp, throbbing pain in her left breast. She also felt that there may have been some white discharge from her nipple. Yesterday, however, patient states that she had her "tubes burned." She has had no headaches. No vomiting. No fever. No bleeding or discharge. She does note that she is one week late on her period. (Consider location/radiation/quality/duration/timing/severity/associated sxs/prior treatment) HPI  History reviewed. No pertinent past medical history.  Past Surgical History  Procedure Date  . Tubal ligation     No family history on file.  History  Substance Use Topics  . Smoking status: Current Everyday Smoker  . Smokeless tobacco: Not on file  . Alcohol Use: No    OB History    Grav Para Term Preterm Abortions TAB SAB Ect Mult Living                  Review of Systems  Constitutional: Negative for fever.  Respiratory: Negative for shortness of breath.   Cardiovascular: Negative for chest pain.  Genitourinary: Negative for dysuria and vaginal discharge.  Psychiatric/Behavioral: Negative for confusion.  All other systems reviewed and are negative.    Allergies  Review of patient's allergies indicates no known allergies.  Home Medications   Current Outpatient Rx  Name Route Sig Dispense Refill  . ACETAMINOPHEN 500 MG PO TABS Oral Take 1,500 mg by mouth every 6 (six) hours as needed. For  pain    . IBUPROFEN 600 MG PO TABS Oral Take 600 mg by mouth every 6 (six) hours as needed. For pain      BP 104/59  Pulse 90  Temp(Src) 98.6 F (37 C) (Oral)  Resp 16  Ht 5\' 4"  (1.626 m)  Wt 130 lb (58.968 kg)  BMI 22.31 kg/m2  SpO2 99%  Physical Exam  Nursing note and vitals reviewed. Constitutional: She is oriented to person, place, and time. She appears well-developed and well-nourished. No distress.       Uncomfortable, but no acute distress  HENT:  Head: Normocephalic and atraumatic.  Eyes: Conjunctivae and EOM are normal. Pupils are equal, round, and reactive to light.  Neck: Neck supple.  Cardiovascular: Normal rate and regular rhythm.  Exam reveals no gallop and no friction rub.   No murmur heard. Pulmonary/Chest: Breath sounds normal. She has no wheezes. She has no rales. She exhibits no tenderness.       Exam conducted with female nursing supervision. Breasts are symmetrical. Tenderness to palpation of the left breast. There was no nipple discharge, no bleeding. No cystic changes appreciated, just mild tenderness. There is no induration. No redness. No abscess.  Abdominal: Soft. Bowel sounds are normal. She exhibits no distension. There is no tenderness. There is no rebound and no guarding.       Right flank tenderness without any anterior abdominal tenderness. Right CVA tenderness.  Musculoskeletal: Normal range of  motion.  Neurological: She is alert and oriented to person, place, and time. No cranial nerve deficit. Coordination normal.  Skin: Skin is warm and dry. No rash noted.  Psychiatric: She has a normal mood and affect.    ED Course  Procedures (including critical care time)   Labs Reviewed  URINALYSIS, ROUTINE W REFLEX MICROSCOPIC  CBC  DIFFERENTIAL  COMPREHENSIVE METABOLIC PANEL   No results found.   No diagnosis found.    MDM  Pt is seen and examined;  Initial history and physical completed.  Will follow.    Results for orders placed during  the hospital encounter of 11/25/11  URINALYSIS, ROUTINE W REFLEX MICROSCOPIC      Component Value Range   Color, Urine YELLOW  YELLOW    APPearance CLEAR  CLEAR    Specific Gravity, Urine 1.012  1.005 - 1.030    pH 7.5  5.0 - 8.0    Glucose, UA NEGATIVE  NEGATIVE (mg/dL)   Hgb urine dipstick TRACE (*) NEGATIVE    Bilirubin Urine NEGATIVE  NEGATIVE    Ketones, ur NEGATIVE  NEGATIVE (mg/dL)   Protein, ur NEGATIVE  NEGATIVE (mg/dL)   Urobilinogen, UA 0.2  0.0 - 1.0 (mg/dL)   Nitrite NEGATIVE  NEGATIVE    Leukocytes, UA NEGATIVE  NEGATIVE   CBC      Component Value Range   WBC 8.2  4.0 - 10.5 (K/uL)   RBC 4.22  3.87 - 5.11 (MIL/uL)   Hemoglobin 12.5  12.0 - 15.0 (g/dL)   HCT 28.4  13.2 - 44.0 (%)   MCV 86.5  78.0 - 100.0 (fL)   MCH 29.6  26.0 - 34.0 (pg)   MCHC 34.2  30.0 - 36.0 (g/dL)   RDW 10.2  72.5 - 36.6 (%)   Platelets 234  150 - 400 (K/uL)  DIFFERENTIAL      Component Value Range   Neutrophils Relative 62  43 - 77 (%)   Neutro Abs 5.1  1.7 - 7.7 (K/uL)   Lymphocytes Relative 20  12 - 46 (%)   Lymphs Abs 1.7  0.7 - 4.0 (K/uL)   Monocytes Relative 10  3 - 12 (%)   Monocytes Absolute 0.8  0.1 - 1.0 (K/uL)   Eosinophils Relative 7 (*) 0 - 5 (%)   Eosinophils Absolute 0.6  0.0 - 0.7 (K/uL)   Basophils Relative 0  0 - 1 (%)   Basophils Absolute 0.0  0.0 - 0.1 (K/uL)  COMPREHENSIVE METABOLIC PANEL      Component Value Range   Sodium 139  135 - 145 (mEq/L)   Potassium 3.5  3.5 - 5.1 (mEq/L)   Chloride 106  96 - 112 (mEq/L)   CO2 24  19 - 32 (mEq/L)   Glucose, Bld 85  70 - 99 (mg/dL)   BUN 5 (*) 6 - 23 (mg/dL)   Creatinine, Ser 4.40  0.50 - 1.10 (mg/dL)   Calcium 8.9  8.4 - 34.7 (mg/dL)   Total Protein 7.0  6.0 - 8.3 (g/dL)   Albumin 3.9  3.5 - 5.2 (g/dL)   AST 11  0 - 37 (U/L)   ALT 8  0 - 35 (U/L)   Alkaline Phosphatase 67  39 - 117 (U/L)   Total Bilirubin 0.6  0.3 - 1.2 (mg/dL)   GFR calc non Af Amer >90  >90 (mL/min)   GFR calc Af Amer >90  >90 (mL/min)  POCT  PREGNANCY, URINE  Component Value Range   Preg Test, Ur NEGATIVE  NEGATIVE   URINE MICROSCOPIC-ADD ON      Component Value Range   WBC, UA 0-2  <3 (WBC/hpf)   RBC / HPF 0-2  <3 (RBC/hpf)   Bacteria, UA RARE  RARE    Ct Abdomen Pelvis Wo Contrast  11/25/2011  *RADIOLOGY REPORT*  Clinical Data: Left flank pain  CT ABDOMEN AND PELVIS WITHOUT CONTRAST  Technique:  Multidetector CT imaging of the abdomen and pelvis was performed following the standard protocol without intravenous contrast.  Comparison: 09/26/2010  Findings: No hydronephrosis.  No urinary calculus.  Cholelithiasis.  Liver, spleen, pancreas, adrenal glands are within normal limits. Nonspecific scattered small bowel air fluid levels without disproportionate dilatation.  Bladder, uterus, and adnexa are within normal limits.  Small amount of free fluid is likely physiologic.  IMPRESSION: No urinary calculus.  Cholelithiasis.  Original Report Authenticated By: Donavan Burnet, M.D.        Patient's urine was fairly unremarkable. Pregnancy test was negative. Comprehensive metabolic panel was normal. CBC was normal with a white count of 8.2. CT scan showed no signs of any kidney stones. There was notation of nonspecific. Scattered small bowel air-fluid levels without distortion or dilatation.  No clear etiology for patient's pain although musculoskeletal would be a consideration. Will give a trial of Toradol and Percocet and anticipate patient will be able to go home. She has no nausea or vomiting.    Prior to going to the CDU. The patient felt much better. She wants to go home. We'll give her prescriptions for pain medications, and she is told to return to the ED to be reexamined for her pain. This week. She may also followup with a primary care doctor. Patient will be given the number for the breast center [facility] for her left breast pain of unknown etiology.   Franny Selvage A. Patrica Duel, MD 11/25/11 [facility] [place]

## 2011-11-25 NOTE — ED Notes (Signed)
Patient reports she is having pain in her right side and right lower side of her back.  She is also complaining of pain in her left breast.   She says her breast is sore to the touch

## 2012-02-17 ENCOUNTER — Emergency Department (HOSPITAL_COMMUNITY)
Admission: EM | Admit: 2012-02-17 | Discharge: 2012-02-17 | Disposition: A | Discharge status: Home / Self Care | Payer: Self-pay | Attending: Emergency Medicine | Admitting: Emergency Medicine

## 2012-02-17 ENCOUNTER — Encounter (HOSPITAL_COMMUNITY): Payer: Self-pay | Admitting: *Deleted

## 2012-02-17 DIAGNOSIS — F17200 Nicotine dependence, unspecified, uncomplicated: Secondary | ICD-10-CM | POA: Insufficient documentation

## 2012-02-17 DIAGNOSIS — Y9289 Other specified places as the place of occurrence of the external cause: Secondary | ICD-10-CM | POA: Insufficient documentation

## 2012-02-17 DIAGNOSIS — T148 Other injury of unspecified body region: Secondary | ICD-10-CM | POA: Insufficient documentation

## 2012-02-17 DIAGNOSIS — W57XXXA Bitten or stung by nonvenomous insect and other nonvenomous arthropods, initial encounter: Secondary | ICD-10-CM | POA: Insufficient documentation

## 2012-02-17 MED ORDER — IBUPROFEN 800 MG PO TABS: 800.0000 mg | ORAL_TABLET | Freq: Three times a day (TID) | ORAL | Status: AC | PRN

## 2012-02-17 NOTE — Discharge Instructions (Signed)
Read the information below.  Return to the ER immediately if you develop pain, redness, swelling, pus draining from the wound, or fevers greater than 100.4.  You may return to the ER at any time for worsening condition or any new symptoms that concern you.  Insect Bite Mosquitoes, flies, fleas, bedbugs, and many other insects can bite. Insect bites are different from insect stings. A sting is when venom is injected into the skin. Some insect bites can transmit infectious diseases. SYMPTOMS  Insect bites usually turn red, swell, and itch for 2 to 4 days. They often go away on their own. TREATMENT  Your caregiver may prescribe antibiotic medicines if a bacterial infection develops in the bite. HOME CARE INSTRUCTIONS  Do not scratch the bite area.   Keep the bite area clean and dry. Wash the bite area thoroughly with soap and water.   Put ice or cool compresses on the bite area.   Put ice in a plastic bag.   Place a towel between your skin and the bag.   Leave the ice on for 20 minutes, 4 times a day for the first 2 to 3 days, or as directed.   You may apply a baking soda paste, cortisone cream, or calamine lotion to the bite area as directed by your caregiver. This can help reduce itching and swelling.   Only take over-the-counter or prescription medicines as directed by your caregiver.   If you are given antibiotics, take them as directed. Finish them even if you start to feel better.  You may need a tetanus shot if:  You cannot remember when you had your last tetanus shot.   You have never had a tetanus shot.   The injury broke your skin.  If you get a tetanus shot, your arm may swell, get red, and feel warm to the touch. This is common and not a problem. If you need a tetanus shot and you choose not to have one, there is a rare chance of getting tetanus. Sickness from tetanus can be serious. SEEK IMMEDIATE MEDICAL CARE IF:   You have increased pain, redness, or swelling in the  bite area.   You see a red line on the skin coming from the bite.   You have a fever.   You have joint pain.   You have a headache or neck pain.   You have unusual weakness.   You have a rash.   You have chest pain or shortness of breath.   You have abdominal pain, nausea, or vomiting.   You feel unusually tired or sleepy.  MAKE SURE YOU:   Understand these instructions.   Will watch your condition.   Will get help right away if you are not doing well or get worse.  Document Released: 10/09/2004 Document Revised: 08/21/2011 Document Reviewed: 04/02/2011 [facility] Patient Information 2012 [place], [place].  If you have no primary doctor, here are some resources that may be helpful:  Medicaid-accepting [facility] Providers:   - [facility]- [address]      [phone]      Mon-Fri 9am-7pm, Sat 9am-1pm   - [facility]- [address] [place]      [phone]   - [facility]- [address] [place]      [phone]   [facility]- [address]      [phone]   - [facility]- [address]      [phone]      Only accepts [place] [employer] patients       after they have her name applied to their card   Self Pay (no insurance) in [facility]:   - Sickle Cell Patients: Dr Willey Blade, [facility]      [address] [place]      [phone]   - [phone]   - Physician Referral Service- [phone]   - [facility]- [address] [place]      [phone]   [facility] [employer]- [address]   - [facility]- see information above      (Speak to [employer] if you do not have insurance)   - [facility]- [address] [place]      [phone]   - [facility] [place]- [address]      [phone]   - [facility]- [address]      [phone]   - Dr  Julio Sicks-  [address], [place]      [phone]   - [facility]- [address]      [phone]   - [facility]- [address]      [phone]      Also [address]      [phone]   - [facility]- [address]      [phone]      1st and 3rd Saturday every month, 10am-1pm Other agencies that provide inexpensive medical care:    [facility]  [phone]    [facility]  [phone]    [facility]  [phone]    [facility]  [phone]    [facility]  [phone]  General Information: Finding a doctor when you do not have health insurance can be tricky. Although you are not limited by an insurance plan, you are of course limited by her finances and how much but he can pay out of pocket.  What are your options if you don't have health insurance?   1) Find a Librarian, academic and Pay Out of Pocket Although you won't have to find out who is covered by your insurance plan, it is a good idea to ask around and get recommendations. You will then need to call the office and see if the doctor you have chosen will accept you as a new patient and what types of options they offer for patients who are self-pay. Some doctors offer discounts or will set up payment plans for their patients who do not have insurance, but you will need to ask so you aren't surprised when you get to your appointment.  2) Contact Your Local Health Department Not all health departments have doctors that can see patients for sick visits, but many do, so it is worth a call to see if yours does. If you don't know where your local health department is, you can check in your phone book. The CDC also has a tool to help you locate your state's health department, and many state websites also have listings of all of their local health departments.  3) Find a Walk-in Clinic If your illness is not likely to be very severe or complicated,  you may want to try a walk in  clinic. These are popping up all over the country in pharmacies, drugstores, and shopping centers. They're usually staffed by nurse practitioners or physician assistants that have been trained to treat common illnesses and complaints. They're usually fairly quick and inexpensive. However, if you have serious medical issues or chronic medical problems, these are probably not your best option

## 2012-02-17 NOTE — ED Notes (Signed)
The pt has had bug bites on her lt leg since Saturday and she says she has difficulty putting pressure on her lt leg since then

## 2012-02-17 NOTE — ED Notes (Signed)
Complaining of insect bites with bruising noted. States now she is only able to put pressure on tips of her toes. Denies pain at this time

## 2012-02-17 NOTE — ED Provider Notes (Signed)
History     CSN: [id number]  Arrival date & time 02/17/12  1844   First MD Initiated Contact with Patient 02/17/12 1858      Chief Complaint  Patient presents with  . Insect Bite    (Consider location/radiation/quality/duration/timing/severity/associated sxs/prior treatment) HPI Comments: Patient reports insect bites on left leg x [place]  Patient states that since then, she has found that she has trouble putting weight on her left heel.  States she can walk around supporting her weight by walking on her left toes but if she puts her heel down her leg goes out from under her.  Denies any pain or swelling of her leg, any discharge from the insect bites.  Denies fevers or chills.  States she was walking earlier and tried to put weight on her whole foot and the leg just went weak.  States she cannot completely stretch it out, but she is not limited by pain, states that it just won't stretch out.  Denies any other injury to her leg.  Denies any recent tick bites.   The history is provided by the patient.    History reviewed. No pertinent past medical history.  Past Surgical History  Procedure Date  . Tubal ligation     No family history on file.  History  Substance Use Topics  . Smoking status: Current Everyday Smoker  . Smokeless tobacco: Not on file  . Alcohol Use: No    OB History    Grav Para Term Preterm Abortions TAB SAB Ect Mult Living                  Review of Systems  Constitutional: Negative for fever and chills.  HENT: Negative for sore throat and neck pain.   Respiratory: Negative for shortness of breath.   Cardiovascular: Negative for chest pain.  Gastrointestinal: Negative for nausea, vomiting, abdominal pain and diarrhea.  Musculoskeletal: Positive for gait problem. Negative for myalgias, back pain, joint swelling and arthralgias.  Skin: Negative for rash.  Neurological: Positive for headaches. Negative for numbness.     Allergies  Review of patient's allergies indicates no known allergies.  Home Medications   Current Outpatient Rx  Name Route Sig Dispense Refill  . ACETAMINOPHEN 500 MG PO TABS Oral Take 1,500 mg by mouth every 6 (six) hours as needed. For pain    . IBUPROFEN 600 MG PO TABS Oral Take 600 mg by mouth every 6 (six) hours as needed. For pain    . IBUPROFEN 800 MG PO TABS Oral Take 1 tablet (800 mg total) by mouth every 8 (eight) hours as needed for pain. 21 tablet 0    BP 112/56  Pulse 75  Temp(Src) 98.6 F (37 C) (Oral)  Resp 18  SpO2 98%  LMP 01/17/2012  Physical Exam  Nursing note and vitals reviewed. Constitutional: She appears well-developed and well-nourished. No distress.  HENT:  Head: Normocephalic and atraumatic.  Neck: Neck supple.  Pulmonary/Chest: Effort normal.  Musculoskeletal: She exhibits no edema and no tenderness.       Lower extremities: strength 5/5, sensation intact, distal pulses intact.   Neurological: She is alert. She exhibits normal muscle tone.  Skin: She is not diaphoretic.          Skin without erythema, edema, tenderness, or excess warmth.      ED Course  Procedures (including critical care time)  Labs Reviewed - No data to display No results found.  7:53 PM Discussed patient with Dr Bernette Mayers.    1. Insect bites       MDM  Afebrile, nontoxic patient with unremarkable leg exam.  Insect bites are old and healing, no signs of superinfection.  There is no edema, erythema, or warmth to the leg.  Pt is able to stand on the leg but will not completely extend knee, but not related to pain.  Leg is nontender, not painful.  No clinical signs of DVT, infection, or injury.  I do not have a reason that patient has weakness when extending leg.  Discussed with Dr Bernette Mayers who agrees with assessment.  Recommends d/c home with NSAIDs, which I have done.  Return precautions given.  Pt declined crutches for support.  I recommended stretching exercises.   Patient verbalizes understanding and agrees with plan.          Dillard Cannon [place], [place] 02/17/12 2022

## 2012-02-19 NOTE — ED Provider Notes (Signed)
Medical screening examination/treatment/procedure(s) were performed by non-physician practitioner and as supervising physician I was immediately available for consultation/collaboration.   Alyla Pietila B. Bernette Mayers, MD 02/19/12 1246

## 2012-08-23 ENCOUNTER — Emergency Department (HOSPITAL_COMMUNITY)
Admission: EM | Admit: 2012-08-23 | Discharge: 2012-08-23 | Disposition: A | Discharge status: Home / Self Care | Payer: Self-pay | Attending: Emergency Medicine | Admitting: Emergency Medicine

## 2012-08-23 ENCOUNTER — Emergency Department (HOSPITAL_COMMUNITY): Payer: Self-pay

## 2012-08-23 ENCOUNTER — Encounter (HOSPITAL_COMMUNITY): Payer: Self-pay

## 2012-08-23 DIAGNOSIS — R42 Dizziness and giddiness: Secondary | ICD-10-CM | POA: Insufficient documentation

## 2012-08-23 DIAGNOSIS — F17200 Nicotine dependence, unspecified, uncomplicated: Secondary | ICD-10-CM | POA: Insufficient documentation

## 2012-08-23 DIAGNOSIS — H538 Other visual disturbances: Secondary | ICD-10-CM | POA: Insufficient documentation

## 2012-08-23 DIAGNOSIS — R5381 Other malaise: Secondary | ICD-10-CM | POA: Insufficient documentation

## 2012-08-23 DIAGNOSIS — R52 Pain, unspecified: Secondary | ICD-10-CM | POA: Insufficient documentation

## 2012-08-23 DIAGNOSIS — R531 Weakness: Secondary | ICD-10-CM

## 2012-08-23 DIAGNOSIS — R51 Headache: Secondary | ICD-10-CM | POA: Insufficient documentation

## 2012-08-23 DIAGNOSIS — R209 Unspecified disturbances of skin sensation: Secondary | ICD-10-CM | POA: Insufficient documentation

## 2012-08-23 DIAGNOSIS — N390 Urinary tract infection, site not specified: Secondary | ICD-10-CM | POA: Insufficient documentation

## 2012-08-23 DIAGNOSIS — N920 Excessive and frequent menstruation with regular cycle: Secondary | ICD-10-CM | POA: Insufficient documentation

## 2012-08-23 LAB — RAPID URINE DRUG SCREEN, HOSP PERFORMED
Amphetamines: NOT DETECTED
Barbiturates: NOT DETECTED
Benzodiazepines: NOT DETECTED
Cocaine: NOT DETECTED
Opiates: NOT DETECTED
Tetrahydrocannabinol: NOT DETECTED

## 2012-08-23 LAB — COMPREHENSIVE METABOLIC PANEL
ALT: 8 U/L (ref 0–35)
AST: 14 U/L (ref 0–37)
Albumin: 4 g/dL (ref 3.5–5.2)
Alkaline Phosphatase: 64 U/L (ref 39–117)
BUN: 9 mg/dL (ref 6–23)
CO2: 26 mEq/L (ref 19–32)
Calcium: 9.4 mg/dL (ref 8.4–10.5)
Chloride: 105 mEq/L (ref 96–112)
Creatinine, Ser: 0.61 mg/dL (ref 0.50–1.10)
GFR calc Af Amer: >90 mL/min (ref >90)
GFR calc non Af Amer: >90 mL/min (ref >90)
Glucose, Bld: 93 mg/dL (ref 70–99)
Potassium: 3.4 mEq/L — ABNORMAL LOW (ref 3.5–5.1)
Sodium: 139 mEq/L (ref 135–145)
Total Bilirubin: 0.4 mg/dL (ref 0.3–1.2)
Total Protein: 7.2 g/dL (ref 6.0–8.3)

## 2012-08-23 LAB — CBC WITH DIFFERENTIAL/PLATELET
Basophils Absolute: 0 10*3/uL (ref 0.0–0.1)
Basophils Relative: 0 % (ref 0–1)
Eosinophils Absolute: 0.1 10*3/uL (ref 0.0–0.7)
Eosinophils Relative: 1 % (ref 0–5)
HCT: 36.7 % (ref 36.0–46.0)
Hemoglobin: 13.2 g/dL (ref 12.0–15.0)
Lymphocytes Relative: 13 % (ref 12–46)
Lymphs Abs: 1.5 10*3/uL (ref 0.7–4.0)
MCH: 30.8 pg (ref 26.0–34.0)
MCHC: 36 g/dL (ref 30.0–36.0)
MCV: 85.5 fL (ref 78.0–100.0)
Monocytes Absolute: 0.7 10*3/uL (ref 0.1–1.0)
Monocytes Relative: 6 % (ref 3–12)
Neutro Abs: 9.4 10*3/uL — ABNORMAL HIGH (ref 1.7–7.7)
Neutrophils Relative %: 81 % — ABNORMAL HIGH (ref 43–77)
Platelets: 253 10*3/uL (ref 150–400)
RBC: 4.29 MIL/uL (ref 3.87–5.11)
RDW: 12.3 % (ref 11.5–15.5)
WBC: 11.6 10*3/uL — ABNORMAL HIGH (ref 4.0–10.5)

## 2012-08-23 LAB — ETHANOL: Alcohol, Ethyl (B): <11 mg/dL (ref 0–11)

## 2012-08-23 LAB — URINALYSIS, ROUTINE W REFLEX MICROSCOPIC
Bilirubin Urine: NEGATIVE
Glucose, UA: NEGATIVE mg/dL
Nitrite: NEGATIVE
Protein, ur: 30 mg/dL — AB
Specific Gravity, Urine: 1.012 (ref 1.005–1.030)
Urobilinogen, UA: 1 mg/dL (ref 0.0–1.0)
pH: 7 (ref 5.0–8.0)

## 2012-08-23 LAB — URINE MICROSCOPIC-ADD ON

## 2012-08-23 LAB — PREGNANCY, URINE: Preg Test, Ur: NEGATIVE

## 2012-08-23 MED ORDER — KETOROLAC TROMETHAMINE 30 MG/ML IJ SOLN
30.0000 mg | Freq: Once | INTRAMUSCULAR | Status: AC
  Administered 2012-08-23: 30 mg via INTRAVENOUS
  Filled 2012-08-23: qty 1

## 2012-08-23 MED ORDER — CEPHALEXIN 500 MG PO CAPS: 500.0000 mg | ORAL_CAPSULE | Freq: Four times a day (QID) | ORAL | Status: DC

## 2012-08-23 MED ORDER — DEXTROSE 5 % IV SOLN
1.0000 g | Freq: Once | INTRAVENOUS | Status: AC
  Administered 2012-08-23: 1 g via INTRAVENOUS
  Filled 2012-08-23: qty 10

## 2012-08-23 MED ORDER — SODIUM CHLORIDE 0.9 % IV BOLUS (SEPSIS): 2000.0000 mL | Freq: Once | INTRAVENOUS | Status: DC

## 2012-08-23 NOTE — ED Notes (Signed)
Per EMS pt leaving out of [employer], bystanders states pt stopped and started going to the ground, helped down by bystanders, states near syncope, states pt was having jerking/tremor like activities where pt stopped on command. Pt states is having a heavy menstrual cycle this month and has been feeling weak. Pt a/o x4, pt ambulatory.

## 2012-08-23 NOTE — ED Notes (Signed)
Family at bedside. 

## 2012-08-23 NOTE — ED Notes (Signed)
WUJ:[id number]<JY> Expected date:<BR> Expected time:<BR> Means of arrival:Ambulance<BR> Comments:<BR> Seizure like activity

## 2012-08-23 NOTE — ED Notes (Addendum)
Pt reports she was at [employer] when she had a syncopal episode.  Pt denies remembering what happened other the "ride on the ambulance".  Pt c/o h/a and generalized body aches.  Pt denies being sick with a cold.  Pt is A&Ox 4.  Pt states "they told me i didn't hit my head but i think i did."  Pt in NAD.  Pt's family at bedside.  Pt also reports bila blurred vision.

## 2012-08-23 NOTE — ED Provider Notes (Signed)
History     CSN: [id number]  Arrival date & time 08/23/12  1414   First MD Initiated Contact with Patient 08/23/12 1511      Chief Complaint  Patient presents with  . Near Syncope    (Consider location/radiation/quality/duration/timing/severity/associated sxs/prior treatment) HPI The patient presents to the ED with a near syncopal event that occurred today.  She reports standing in line at the [employer] and the next thing that she remembers is waking up with the EMS.  She complains of R occipital headache, bilateral blurry vision, and R LE tingling.  Reports LNMP today.  Denies vomiting, fever, chills, dysuria, chest pain, shortness of breath back, pain, numbness, gait abnormality or diarrhea.   Additional information was provided by the patients husband who spoke with witnesses.  He reports that she mentioned that she reported feeling dizzy and was found holding on to her cart outside the [employer].  She was caught by a bystander and was sat down outside.  There was not bowl or bladder incontinence.  There was a whole body shaking that was stopped on command.    History reviewed. No pertinent past medical history.  Past Surgical History  Procedure Date  . Tubal ligation     No family history on file.  History  Substance Use Topics  . Smoking status: Current Every Day Smoker  . Smokeless tobacco: Not on file  . Alcohol Use: No    OB History    Grav Para Term Preterm Abortions TAB SAB Ect Mult Living                  Review of Systems  Allergies  Review of patient's allergies indicates no known allergies.  Home Medications   Current Outpatient Rx  Name  Route  Sig  Dispense  Refill  . ACETAMINOPHEN 500 MG PO TABS   Oral   Take 1,000 mg by mouth every 6 (six) hours as needed. For pain           BP 119/65  Pulse 78  Temp 98.4 F (36.9 C) (Oral)  Resp 20  SpO2 100%  LMP 08/23/2012  Physical Exam  Vitals reviewed. Constitutional: She appears  well-developed and well-nourished. She is cooperative.  HENT:  Head: Normocephalic and atraumatic. Head is without raccoon's eyes, without Battle's sign, without abrasion, without contusion and without laceration.    Mouth/Throat: Mucous membranes are normal. No oropharyngeal exudate or posterior oropharyngeal edema.  Eyes: Conjunctivae normal and EOM are normal.  Neck: Neck supple.  Cardiovascular: Normal rate, regular rhythm, S1 normal, S2 normal and normal heart sounds.   No murmur heard. Abdominal: There is tenderness in the left lower quadrant. There is no rigidity, no rebound and no guarding.    Neurological: She is alert. She is disoriented. No cranial nerve deficit.       Patient oriented to city but not person, or time.  The patient gave a poor effort during strength exam and had L sided weakness.  During the encounter the patient was able to take off her sweatshirt with the aid of L hand and pulled up the sheets with her L hand only.    ED Course  Procedures (including critical care time)   Labs Reviewed  URINALYSIS, ROUTINE W REFLEX MICROSCOPIC  CBC WITH DIFFERENTIAL  COMPREHENSIVE METABOLIC PANEL  PREGNANCY, URINE  URINE RAPID DRUG SCREEN (HOSP PERFORMED)  ETHANOL   Ct Head Wo Contrast  08/23/2012  *RADIOLOGY REPORT*  Clinical Data:  Syncopal episode earlier today.  Headache.  CT HEAD WITHOUT CONTRAST  Technique:  Contiguous axial images were obtained from the base of the skull through the vertex without contrast  Comparison:  None.  Findings:  The brain has a normal appearance without evidence for hemorrhage, acute infarction, hydrocephalus, or mass lesion.  There is no extra axial fluid collection.  The skull and paranasal sinuses are normal.  IMPRESSION: Normal CT of the head without contrast.   Original Report Authenticated By: Davonna Belling, M.D.     I advised the family and the patient the current results of her testing.  I advised her that should have a neurological  issue that is contributing to this and that further examination by a neurologist would be needed.  I advised the patient that she does have a UTI slightly, but that this may not be the sole answer to her issues.  I advised that this could cause some of her symptomatology was not a definitive cause.  Advised her that hydration will be important and followup with the neurologist.  I advised this could be an evolving process and, that she'll need to return here for any worsening in her   MDM  MDM Reviewed: vitals and nursing note Interpretation: labs and CT scan            Carlyle Dolly, PA-C 08/23/12 1912

## 2012-08-25 ENCOUNTER — Encounter (HOSPITAL_COMMUNITY): Payer: Self-pay | Admitting: *Deleted

## 2012-08-25 ENCOUNTER — Observation Stay (HOSPITAL_COMMUNITY): Payer: Self-pay

## 2012-08-25 ENCOUNTER — Observation Stay (HOSPITAL_COMMUNITY)
Admission: EM | Admit: 2012-08-25 | Discharge: 2012-08-26 | Disposition: A | Discharge status: Home / Self Care | Payer: Self-pay | Attending: Internal Medicine | Admitting: Internal Medicine

## 2012-08-25 DIAGNOSIS — F8081 Childhood onset fluency disorder: Secondary | ICD-10-CM

## 2012-08-25 DIAGNOSIS — R55 Syncope and collapse: Principal | ICD-10-CM

## 2012-08-25 DIAGNOSIS — R1031 Right lower quadrant pain: Secondary | ICD-10-CM | POA: Insufficient documentation

## 2012-08-25 DIAGNOSIS — F17200 Nicotine dependence, unspecified, uncomplicated: Secondary | ICD-10-CM | POA: Insufficient documentation

## 2012-08-25 DIAGNOSIS — R51 Headache: Secondary | ICD-10-CM | POA: Insufficient documentation

## 2012-08-25 DIAGNOSIS — R531 Weakness: Secondary | ICD-10-CM

## 2012-08-25 DIAGNOSIS — F985 Adult onset fluency disorder: Secondary | ICD-10-CM | POA: Insufficient documentation

## 2012-08-25 DIAGNOSIS — R42 Dizziness and giddiness: Secondary | ICD-10-CM

## 2012-08-25 DIAGNOSIS — R5381 Other malaise: Secondary | ICD-10-CM | POA: Insufficient documentation

## 2012-08-25 DIAGNOSIS — H532 Diplopia: Secondary | ICD-10-CM

## 2012-08-25 DIAGNOSIS — N390 Urinary tract infection, site not specified: Secondary | ICD-10-CM | POA: Insufficient documentation

## 2012-08-25 LAB — CBC WITH DIFFERENTIAL/PLATELET
Hemoglobin: 13.2 g/dL (ref 12.0–15.0)
Lymphocytes Relative: 40 % (ref 12–46)
Lymphs Abs: 1.7 10*3/uL (ref 0.7–4.0)
Neutro Abs: 2.1 10*3/uL (ref 1.7–7.7)
Neutrophils Relative %: 49 % (ref 43–77)
Platelets: 242 10*3/uL (ref 150–400)
RBC: 4.39 MIL/uL (ref 3.87–5.11)
WBC: 4.2 10*3/uL (ref 4.0–10.5)

## 2012-08-25 LAB — COMPREHENSIVE METABOLIC PANEL
ALT: 9 U/L (ref 0–35)
Alkaline Phosphatase: 60 U/L (ref 39–117)
CO2: 25 mEq/L (ref 19–32)
Chloride: 106 mEq/L (ref 96–112)
GFR calc Af Amer: >90 mL/min (ref >90)
GFR calc non Af Amer: >90 mL/min (ref >90)
Glucose, Bld: 87 mg/dL (ref 70–99)
Potassium: 3.4 mEq/L — ABNORMAL LOW (ref 3.5–5.1)
Sodium: 141 mEq/L (ref 135–145)
Total Bilirubin: 0.3 mg/dL (ref 0.3–1.2)
Total Protein: 7.5 g/dL (ref 6.0–8.3)

## 2012-08-25 LAB — URINALYSIS, ROUTINE W REFLEX MICROSCOPIC
Bilirubin Urine: NEGATIVE
Ketones, ur: NEGATIVE mg/dL
Nitrite: NEGATIVE
pH: 8 (ref 5.0–8.0)

## 2012-08-25 LAB — URINE CULTURE

## 2012-08-25 LAB — GLUCOSE, CAPILLARY: Glucose-Capillary: 80 mg/dL (ref 70–99)

## 2012-08-25 LAB — URINE MICROSCOPIC-ADD ON

## 2012-08-25 MED ORDER — CEPHALEXIN 500 MG PO CAPS
500.0000 mg | ORAL_CAPSULE | Freq: Four times a day (QID) | ORAL | Status: DC
  Administered 2012-08-26 (×3): 500 mg via ORAL
  Filled 2012-08-25 (×5): qty 1

## 2012-08-25 MED ORDER — DEXTROSE-NACL 5-0.9 % IV SOLN
INTRAVENOUS | Status: DC
  Administered 2012-08-25: via INTRAVENOUS

## 2012-08-25 MED ORDER — SODIUM CHLORIDE 0.9 % IJ SOLN: 3.0000 mL | Freq: Two times a day (BID) | INTRAMUSCULAR | Status: DC

## 2012-08-25 MED ORDER — ONDANSETRON HCL 4 MG PO TABS
4.0000 mg | ORAL_TABLET | Freq: Four times a day (QID) | ORAL | Status: DC | PRN
Start: 2012-08-25 — End: 2012-08-26

## 2012-08-25 MED ORDER — ONDANSETRON HCL 4 MG/2ML IJ SOLN: 4.0000 mg | Freq: Four times a day (QID) | INTRAMUSCULAR | Status: DC | PRN

## 2012-08-25 NOTE — Progress Notes (Signed)
26 yo fainted on Monday, 2 days ago, and workup at [facility] only showed a UTI, for which she was placed on an antibiotic.  She fainted last night, and since has had stuttering of speech, difficulty walking, and some double vision.  Exam shows a young woman who does stutter.  EOM full to my exam.  No facial paralysis.  Sensory and motor intact.  Recommend neurology consultation.

## 2012-08-25 NOTE — ED Provider Notes (Signed)
History     CSN: [id number]  Arrival date & time 08/25/12  1211   First MD Initiated Contact with Patient 08/25/12 1428      Chief Complaint  Patient presents with  . Loss of Consciousness    (Consider location/radiation/quality/duration/timing/severity/associated sxs/prior treatment) HPI Comments: Patient was seen in ED on Monday after syncopal episode in grocery store line.  Pt had negative CT, labs, positive UTI.  Pt started taking antibiotics last night.  States last night she was walking into her son's room and then waking up on the floor.  States this was unwitnessed, she is unsure how long she was out.  Prior to the syncopal episode pt felt dizzy and hot.  States following the episode she continued to feel dizzy, but also had developed double vision, stuttering speech, has been weak and dropping things with both hands, and has decreased sensation and weakness in her left leg.  The double vision is present currently but has also been coming and going since her syncope/fall.   Currently menstruating, has some associated RLQ cramping since her period began.  Denies fevers, recent illness, CP, palpitations, SOB, N/V/D.   Patient is a 26 y.o. female presenting with syncope. The history is provided by the patient.  Loss of Consciousness Associated symptoms include numbness and weakness. Pertinent negatives include no abdominal pain, chest pain, chills, fever, nausea or vomiting.    History reviewed. No pertinent past medical history.  Past Surgical History  Procedure Date  . Tubal ligation     No family history on file.  History  Substance Use Topics  . Smoking status: Current Every Day Smoker  . Smokeless tobacco: Not on file  . Alcohol Use: No    OB History    Grav Para Term Preterm Abortions TAB SAB Ect Mult Living                  Review of Systems  Constitutional: Negative for fever and chills.  Respiratory: Negative for shortness of breath.   Cardiovascular:  Positive for syncope. Negative for chest pain and palpitations.  Gastrointestinal: Negative for nausea, vomiting, abdominal pain and diarrhea.  Neurological: Positive for dizziness, syncope, weakness and numbness.    Allergies  Review of patient's allergies indicates no known allergies.  Home Medications   Current Outpatient Rx  Name  Route  Sig  Dispense  Refill  . ACETAMINOPHEN 500 MG PO TABS   Oral   Take 1,000 mg by mouth every 6 (six) hours as needed. For pain         . CEPHALEXIN 500 MG PO CAPS   Oral   Take 1 capsule (500 mg total) by mouth 4 (four) times daily.   28 capsule   0     BP 140/67  Pulse 84  Temp 98.4 F (36.9 C) (Oral)  Resp 18  SpO2 98%  LMP 08/23/2012  Physical Exam  Nursing note and vitals reviewed. Constitutional: She appears well-developed and well-nourished. No distress.  HENT:  Head: Normocephalic and atraumatic.  Neck: Normal range of motion. Neck supple.  Cardiovascular: Normal rate and regular rhythm.   Pulmonary/Chest: Effort normal and breath sounds normal. No respiratory distress. She has no wheezes. She has no rales.  Abdominal: Soft. She exhibits no distension. There is no tenderness. There is no rebound and no guarding.  Neurological: She is alert. A sensory deficit is present. No cranial nerve deficit. GCS eye subscore is 4. GCS verbal subscore is 5. GCS motor  subscore is 6.       Pt with decreased strength throughout.  Finger to nose abnormal with past pointing with both hands (pt blames diplopia), unable to perform heel to shin with left leg (pt states leg won't respond).  Rapid alternating movements normal.  CN intact. EOMs intact.  Dizziness increases with head movement.    Skin: She is not diaphoretic.    ED Course  Procedures (including critical care time)   Labs Reviewed  CBC WITH DIFFERENTIAL  COMPREHENSIVE METABOLIC PANEL  URINALYSIS, ROUTINE W REFLEX MICROSCOPIC  URINE CULTURE   Ct Head Wo Contrast  08/23/2012   *RADIOLOGY REPORT*  Clinical Data:  Syncopal episode earlier today.  Headache.  CT HEAD WITHOUT CONTRAST  Technique:  Contiguous axial images were obtained from the base of the skull through the vertex without contrast  Comparison:  None.  Findings:  The brain has a normal appearance without evidence for hemorrhage, acute infarction, hydrocephalus, or mass lesion.  There is no extra axial fluid collection.  The skull and paranasal sinuses are normal.  IMPRESSION: Normal CT of the head without contrast.   Original Report Authenticated By: Davonna Belling, M.D.    3:22 PM Discussed patient with Dr Ignacia Palma who will also see the patient.  I also spoke with Dr Roseanne Reno, neuro hospitalist, who will see and examine patient.  Recommends MRI without contrast, no repeat CT at this time.    3:38 PM Pt to move to CDU pending labs, MR brain without contrast, and neurology consult.  Dispo pending results and Dr Marca Ancona recommendations.    1. Syncope   2. Dizziness   3. Diplopia   4. Weakness     MDM  Pt with two episodes of syncope this week.  Seen 2 days ago in ED with workup significant only for UTI, negative head CT.  Since syncopal episode last night, pt c/o diplopia, dizziness (room spinning), bilateral hand weakness, left left weakness and tingling.  Pt to be seen by Neuro hospitalist, will have labs, repeat urine, MR brain without contrast.  Patient signed out to [facility], PA-C, who assumes care of patient while holding in CDU.          [place], [place] 08/25/12 1545

## 2012-08-25 NOTE — Consult Note (Signed)
NEURO HOSPITALIST CONSULT NOTE    Reason for Consult: Recurrent syncope with new onset double vision in speech abnormality.  HPI:                                                                                                                                          Molly Beard is an 26 y.o. female presenting with a history of 2 episodes of loss of consciousness over the past 2 days. No seizure activity was described. Patient apparently had warning of feeling lightheaded and dizzy prior to losing consciousness. She'll CT scan of her head on 08/23/2012 which was normal. Laboratory studies were unremarkable except for indications of probable urinary tract infection, for which she was given Keflex. Patient has been afebrile. She's complaining of new onset of stuttering of speech since last night, along with diplopia. She has no headache nor report of head injury. MRI of the brain is pending.  History reviewed. No pertinent past medical history.  Past Surgical History  Procedure Date  . Tubal ligation     No family history on file.   Social History:  reports that she has been smoking.  She does not have any smokeless tobacco history on file. She reports that she does not drink alcohol or use illicit drugs.  No Known Allergies  MEDICATIONS:                                                                                                                     Prior to Admission:  Tylenol 500 mg 2 tablets every 6 hours when necessary for pain Keflex 500 mg 4 times a day     Blood pressure 140/67, pulse 84, temperature 98.4 F (36.9 C), temperature source Oral, resp. rate 18, last menstrual period 08/23/2012, SpO2 98.00%.   Neurologic Examination:  Mental Status: Alert, oriented x3.  Nonphysiologic stuttering of speech, without evidence of aphasia. Able to follow  commands without difficulty. Cranial Nerves: II-Visual fields were normal. III/IV/VI-Pupils were equal and reacted. Extraocular movements were full and conjugate. Complaining of horizontal diplopia, equal in all fields of gaze.  V/VII-n subjective right facial numbness; no facial weakness. VIII-normal. X-no dysarthria; symmetrical palatal movement. XII-midline tongue extension Motor: 5/5 bilaterally with normal tone and bulk, except for poor effort with manual testing of left lower extremity. Sensory: Numbness left lower leg. Deep Tendon Reflexes: 2+ and symmetric. Plantars: Mute bilaterally Cerebellar: Normal finger-to-nose testing.   No results found for this basename: cbc, bmp, coags, chol, tri, ldl, hga1c    Results for orders placed during the hospital encounter of 08/25/12 (from the past 48 hour(s))  CBC WITH DIFFERENTIAL     Status: Normal   Collection Time   08/25/12  3:28 PM      Component Value Range Comment   WBC 4.2  4.0 - 10.5 K/uL    RBC 4.39  3.87 - 5.11 MIL/uL    Hemoglobin 13.2  12.0 - 15.0 g/dL    HCT 09.8  11.9 - 14.7 %    MCV 86.8  78.0 - 100.0 fL    MCH 30.1  26.0 - 34.0 pg    MCHC 34.6  30.0 - 36.0 g/dL    RDW 82.9  56.2 - 13.0 %    Platelets 242  150 - 400 K/uL    Neutrophils Relative 49  43 - 77 %    Neutro Abs 2.1  1.7 - 7.7 K/uL    Lymphocytes Relative 40  12 - 46 %    Lymphs Abs 1.7  0.7 - 4.0 K/uL    Monocytes Relative 9  3 - 12 %    Monocytes Absolute 0.4  0.1 - 1.0 K/uL    Eosinophils Relative 2  0 - 5 %    Eosinophils Absolute 0.1  0.0 - 0.7 K/uL    Basophils Relative 0  0 - 1 %    Basophils Absolute 0.0  0.0 - 0.1 K/uL     Ct Head Wo Contrast  08/23/2012  *RADIOLOGY REPORT*  Clinical Data:  Syncopal episode earlier today.  Headache.  CT HEAD WITHOUT CONTRAST  Technique:  Contiguous axial images were obtained from the base of the skull through the vertex without contrast  Comparison:  None.  Findings:  The brain has a normal appearance  without evidence for hemorrhage, acute infarction, hydrocephalus, or mass lesion.  There is no extra axial fluid collection.  The skull and paranasal sinuses are normal.  IMPRESSION: Normal CT of the head without contrast.   Original Report Authenticated By: Davonna Belling, M.D.    Assessment/Plan: 1. Recurrent episodes of syncope of unclear etiology. 2. Nonphysiologic pattern of stuttering. 3. No extraocular movement disorder.  Recommendations: 1. MRI of the brain without contrast. 2. Admission for telemetry monitoring to rule out significant cardiac dysrhythmias etiology for recurrent syncopal spells. 3. Routine EEG.   [facility] [phone]  08/25/2012, 3:56 PM

## 2012-08-25 NOTE — ED Provider Notes (Signed)
Patient placed in CDU by Trixie Dredge, PA-C for further syncope workup. Patient care resumed from [facility], [place] .  Patient is here for syncope x2 with stuttering and R. weakness and has received full syncope workup on Monday, more test today.   Plan per previous provider is to consult with neurology and await a CTA.  Patient re-evaluated and is resting comfortably, VSS, with no new complaints or concerns at this time.  On exam: hemodynamically stable, NAD, heart w/ RRR, lungs CTAB, Chest & abd non-tender, no peripheral edema or calf tenderness, patient stutters with speech.  BP 140/67  Pulse 84  Temp 98.4 F (36.9 C) (Oral)  Resp 18  SpO2 98%  LMP 08/23/2012  Discussed with patient current lab and imaging results as well as their care plan, patient questions answered.  Patient is amenable to the plan.   8:45 PM I have added a UA and pregnancy test patient's labs.    CTA with: No acute or focal intracranial abnormality. 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  Patient will be admitted to the hospitalist service for telemetry monitoring and routine EEG.  I discussed all these things the patient and her family. Patient states she remains dizzy and is stuttering.  She denies pain of any sort.  Dahlia Client Viann Nielson, PA-C 08/25/12 2049

## 2012-08-25 NOTE — ED Notes (Signed)
Pt sts on Monday went to grocery store and then she woke up in Ambulance.  Pt went to [facility] and couldnot remember.  Had normal CT that was normal, but urine showed uti.  Pt was placed on anbx and to follow up with neuro.  Pt states she had syncopal episode last nite and now stuttering.  Pt has blur in vision and dizziness.  Pt has pain to posterior head.

## 2012-08-25 NOTE — ED Provider Notes (Signed)
Medical screening examination/treatment/procedure(s) were conducted as a shared visit with non-physician practitioner(s) and myself.  I personally evaluated the patient during the encounter 26 yo fainted on Monday, 2 days ago, and workup at [facility] only showed a UTI, for which she was placed on an antibiotic. She fainted last night, and since has had stuttering of speech, difficulty walking, and some double vision. Exam shows a young woman who does stutter. EOM full to my exam. No facial paralysis. Sensory and motor intact. Recommend neurology consultation.    Carleene Cooper III, MD 08/25/12 2013

## 2012-08-25 NOTE — ED Provider Notes (Signed)
2:38 PM  Date: 08/25/2012  Rate: 83  Rhythm: normal sinus rhythm  QRS Axis: right  Intervals: normal  ST/T Wave abnormalities: normal  Conduction Disutrbances:none  Narrative Interpretation: Borderline EKG  Old EKG Reviewed: unchanged    Carleene Cooper III, MD 08/25/12 1439

## 2012-08-26 ENCOUNTER — Observation Stay (HOSPITAL_COMMUNITY): Payer: Self-pay

## 2012-08-26 ENCOUNTER — Encounter (HOSPITAL_COMMUNITY): Payer: Self-pay | Admitting: *Deleted

## 2012-08-26 DIAGNOSIS — F449 Dissociative and conversion disorder, unspecified: Secondary | ICD-10-CM

## 2012-08-26 DIAGNOSIS — R55 Syncope and collapse: Secondary | ICD-10-CM

## 2012-08-26 DIAGNOSIS — N390 Urinary tract infection, site not specified: Secondary | ICD-10-CM

## 2012-08-26 MED ORDER — POTASSIUM CHLORIDE CRYS ER 20 MEQ PO TBCR
40.0000 meq | EXTENDED_RELEASE_TABLET | Freq: Once | ORAL | Status: AC
  Administered 2012-08-26: 40 meq via ORAL
  Filled 2012-08-26: qty 2

## 2012-08-26 MED ORDER — BUSPIRONE HCL 10 MG PO TABS
10.0000 mg | ORAL_TABLET | Freq: Three times a day (TID) | ORAL | Status: DC
  Administered 2012-08-26: 10 mg via ORAL
  Filled 2012-08-26 (×2): qty 1

## 2012-08-26 MED ORDER — BUSPIRONE HCL 10 MG PO TABS: 10.0000 mg | ORAL_TABLET | Freq: Three times a day (TID) | ORAL | Status: DC

## 2012-08-26 NOTE — ED Provider Notes (Signed)
Medical screening examination/treatment/procedure(s) were performed by non-physician practitioner and as supervising physician I was immediately available for consultation/collaboration.  Starsky Nanna, MD 08/26/12 0137 

## 2012-08-26 NOTE — Progress Notes (Signed)
  Echocardiogram 2D Echocardiogram has been performed.  Molly Beard 08/26/2012, 9:29 AM

## 2012-08-26 NOTE — H&P (Signed)
[facility] History and Physical  [name] JXB:[id number] DOB: 1985-11-12    PCP:   Molly Oats, MD   Chief Complaint: Diplopia, syncope and stuttering.  HPI: Molly Beard is an 26 y.o. female with no prior psychiatric diagnosis, healthy otherwise on no chronic medication, presents to ER 3 days ago with a syncopal episode.  She was subsequently discharged with plan to follow up with neurology, presents again to [facility] ER with another syncope.  Family reported no seizure activities, no bowel or bladder incontinence. She had no fever, chills, abdominal pain or cramps, nausea or vomiting.  She has had no headache, but reported monocular diplopia.  She also started to have intermittent stuttering.  She reported not being depressed, anxious, or any family or social situation changes.  She denied drug use, and doesn't smoke nor drink.  Previous evaluation included a negative UDS, and neg urinary pregnancy test, and unremarkable serology.  ER work up on this visit included unremarkable serology, and a negative MRI without contrast.  She was seen in consultation with neurology in the ER who recommended that she be admitted for cardiac work up, and EEG, but her stuttering was felt to be non-physiological.  Hospitalist was asked to admit her for these reasons.  Rewiew of Systems:  Constitutional: Negative for malaise, fever and chills. No significant weight loss or weight gain Eyes: Negative for eye pain, redness and discharge, or flashes of light. ENMT: Negative for ear pain, hoarseness, nasal congestion, sinus pressure and sore throat. No headaches; tinnitus, drooling, or problem swallowing. Cardiovascular: Negative for chest pain, palpitations, diaphoresis, dyspnea and peripheral edema. ; No orthopnea, PND Respiratory: Negative for cough, hemoptysis, wheezing and stridor. No pleuritic chestpain. Gastrointestinal: Negative for nausea, vomiting, diarrhea, constipation, abdominal pain, melena,  blood in stool, hematemesis, jaundice and rectal bleeding.    Genitourinary: Negative for frequency, dysuria, incontinence,flank pain and hematuria; Musculoskeletal: Negative for back pain and neck pain. Negative for swelling and trauma.;  Skin: . Negative for pruritus, rash, abrasions, bruising and skin lesion.; ulcerations Neuro: Negative for headache, lightheadedness and neck stiffness. Negative for weakness, extremity weakness, burning feet, involuntary movement, seizure and syncope.  Psych: negative for anxiety, depression, insomnia, tearfulness, panic attacks, hallucinations, paranoia, suicidal or homicidal ideation    History reviewed. No pertinent past medical history.  Past Surgical History  Procedure Date  . Tubal ligation     Medications:  HOME MEDS: Prior to Admission medications   Medication Sig Start Date End Date Taking? Authorizing Provider  acetaminophen (TYLENOL) 500 MG tablet Take 1,000 mg by mouth every 6 (six) hours as needed. For pain   Yes Historical Provider, MD  cephALEXin (KEFLEX) 500 MG capsule Take 1 capsule (500 mg total) by mouth 4 (four) times daily. 08/23/12  Yes Jamesetta Orleans Lawyer, PA-C     Allergies:  No Known Allergies  Social History:   reports that she has been smoking.  She does not have any smokeless tobacco history on file. She reports that she does not drink alcohol or use illicit drugs.  Family History: History reviewed. No pertinent family history.   Physical Exam: Filed Vitals:   08/25/12 1730 08/25/12 1800 08/25/12 2003 08/25/12 2210  BP:   116/72 106/69  Pulse:   78 65  Temp:   97.5 F (36.4 C) 98.7 F (37.1 C)  TempSrc:   Oral Oral  Resp: 14 17 18    SpO2:   97% 97%   Blood pressure 106/69, pulse 65, temperature 98.7 F (  37.1 C), temperature source Oral, resp. rate 18, last menstrual period 08/23/2012, SpO2 97.00%.  GEN:  Pleasant  patient lying in the stretcher in no acute distress; cooperative with exam. PSYCH:  alert  and oriented x4; does not appear anxious or depressed; affect is appropriate. HEENT: Mucous membranes pink and anicteric; PERRLA; EOM intact; no cervical lymphadenopathy nor thyromegaly or carotid bruit; no JVD; There were no stridor. Neck is very supple. Breasts:: Not examined CHEST WALL: No tenderness CHEST: Normal respiration, clear to auscultation bilaterally.  HEART: Regular rate and rhythm.  There are no murmur, rub, or gallops.   BACK: No kyphosis or scoliosis; no CVA tenderness ABDOMEN: soft and non-tender; no masses, no organomegaly, normal abdominal bowel sounds; no pannus; no intertriginous candida. There is no rebound and no distention. Rectal Exam: Not done EXTREMITIES: No bone or joint deformity; age-appropriate arthropathy of the hands and knees; no edema; no ulcerations.  There is no calf tenderness. Genitalia: not examined PULSES: 2+ and symmetric SKIN: Normal hydration no rash or ulceration CNS: Cranial nerves 2-12 grossly intact no focal lateralizing neurologic deficit.  Speech is fluent; uvula elevated with phonation, facial symmetry and tongue midline. DTR are normal bilaterally, cerebella exam is intact, barbinski is negative and strengths are equaled bilaterally.  No sensory loss. She does have intermittent stuttering.  She has no diplopia with monocular vision.   Labs on Admission:  Basic Metabolic Panel:  Lab 08/25/12 1610 08/23/12 1630  NA 141 139  K 3.4* 3.4*  CL 106 105  CO2 25 26  GLUCOSE 87 93  BUN 8 9  CREATININE 0.60 0.61  CALCIUM 9.6 9.4  MG -- --  PHOS -- --   Liver Function Tests:  Lab 08/25/12 1528 08/23/12 1630  AST 15 14  ALT 9 8  ALKPHOS 60 64  BILITOT 0.3 0.4  PROT 7.5 7.2  ALBUMIN 4.1 4.0   No results found for this basename: LIPASE:5,AMYLASE:5 in the last 168 hours No results found for this basename: AMMONIA:5 in the last 168 hours CBC:  Lab 08/25/12 1528 08/23/12 1630  WBC 4.2 11.6*  NEUTROABS 2.1 9.4*  HGB 13.2 13.2  HCT 38.1  36.7  MCV 86.8 85.5  PLT 242 253   Cardiac Enzymes: No results found for this basename: CKTOTAL:5,CKMB:5,CKMBINDEX:5,TROPONINI:5 in the last 168 hours  CBG:  Lab 08/25/12 1352  GLUCAP 80     Radiological Exams on Admission: Mr Brain Wo Contrast  08/25/2012  *RADIOLOGY REPORT*  Clinical Data: Syncope with diplopia and bilateral hand weakness.  MRI HEAD WITHOUT CONTRAST  Technique:  Multiplanar, multiecho pulse sequences of the brain and surrounding structures were obtained according to standard protocol without intravenous contrast.  Comparison: 08/23/2012.  Findings: There is no evidence for acute infarction, intracranial hemorrhage, mass lesion, hydrocephalus, or extra-axial fluid. There is no atrophy or white matter disease.  The major intracranial vascular structures are widely patent.  Calvarium and skull base are unremarkable.  No midline abnormalities.  Moderate nasopharyngeal adenoidal hypertrophy common in this age. Clear paranasal sinuses.  Minimal right mastoid fluid likely effusion.  IMPRESSION: No acute or focal intracranial abnormality.  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   Original Report Authenticated By: Davonna Belling, M.D.     Assessment/Plan Present on Admission:  . Stuttering . Syncope and collapse . Diplopia . UTI (lower urinary tract infection)   PLAN:  Will admit her for cardiac monitoring.  I will obtain a cardiac echo expecting normal structural  heart.  She will be followed by neurology, and will obtain EEG as recommended.  She has a UTI, and will be treated.  The stuttering is felt to be non-physiological, and hopefully it will be self limiting.  She is stable, full code, and will be admitted to [facility] service.  I did tell her our assessment and plans, and she understood and agreed.  Other plans as per orders.  Code Status: full code.   Houston Siren, MD. [facility] Pager [phone] 7pm to  7am.  08/26/2012, 2:59 AM

## 2012-08-26 NOTE — Progress Notes (Signed)
[facility] PROGRESS NOTE  RESHONDA KOERBER ZOX:[id number] DOB: [birth year] DOA: 08/25/2012 PCP: Sheila Oats, MD  Assessment/Plan: . Syncope and collapse: no further episodes. Admitted to r/o significant cardiac dysrhythmias. Tele without event. EKG NSR. Results of echo pending. Results of EEG pending. Seen by neuro in ED and will follow. CT/MRI neg.  Reports dizziness with walking. Will check orthostatics and request PT.  . Diplopia: seen by neuro in ED who opined no extraocular movement disorder. Pt reports improvement. EEG pending. See #1.  Marland Kitchen UTI (lower urinary tract infection)  - Stuttering: nonphysiologic pattern per neuro. Inconsistent stuttering. Will request [facility] consult.     Code Status: full Family Communication: mother in law at bedside Disposition Plan: home when ready. Hopefully this afternoon   Consultants:  Neuro  BH  Procedures:  EEG  Antibiotics:  Keflex 08/25/12>>  HPI/Subjective: Sitting up in bed. Reports improved vision and mild dizziness with walking to BR. Denies pain/discomfort.   Objective: Filed Vitals:   08/25/12 1800 08/25/12 2003 08/25/12 2210 08/26/12 0500  BP:  116/72 106/69 96/61  Pulse:  78 65 66  Temp:  97.5 F (36.4 C) 98.7 F (37.1 C) 97.9 F (36.6 C)  TempSrc:  Oral Oral   Resp: 17 18  16   SpO2:  97% 97% 100%   No intake or output data in the 24 hours ending 08/26/12 0934 There were no vitals filed for this visit.  Exam:   General:  Awake alert oriented x3  Cardiovascular: RRR No MGR No LEE  Respiratory: normal effort. BSCTAB No wheeze  Abdomen: soft +BS non-tender to palpation  Neuro cranial nerve II-XII intact. Speech clear but is stuttering smoothly  Data Reviewed: Basic Metabolic Panel:  Lab 08/25/12 8119 08/23/12 1630  NA 141 139  K 3.4* 3.4*  CL 106 105  CO2 25 26  GLUCOSE 87 93  BUN 8 9  CREATININE 0.60 0.61  CALCIUM 9.6 9.4  MG -- --  PHOS -- --   Liver Function Tests:  Lab 08/25/12 1528  08/23/12 1630  AST 15 14  ALT 9 8  ALKPHOS 60 64  BILITOT 0.3 0.4  PROT 7.5 7.2  ALBUMIN 4.1 4.0   No results found for this basename: LIPASE:5,AMYLASE:5 in the last 168 hours No results found for this basename: AMMONIA:5 in the last 168 hours CBC:  Lab 08/25/12 1528 08/23/12 1630  WBC 4.2 11.6*  NEUTROABS 2.1 9.4*  HGB 13.2 13.2  HCT 38.1 36.7  MCV 86.8 85.5  PLT 242 253   Cardiac Enzymes: No results found for this basename: CKTOTAL:5,CKMB:5,CKMBINDEX:5,TROPONINI:5 in the last 168 hours BNP (last 3 results) No results found for this basename: PROBNP:3 in the last 8760 hours CBG:  Lab 08/25/12 1352  GLUCAP 80    Recent Results (from the past 240 hour(s))  URINE CULTURE     Status: Normal   Collection Time   08/23/12  4:00 PM      Component Value Range Status Comment   Specimen Description URINE, CLEAN CATCH   Final    Special Requests NONE   Final    Culture  Setup Time 08/24/2012 03:07   Final    Colony Count 30,000 COLONIES/ML   Final    Culture     Final    Value: Multiple bacterial morphotypes present, none predominant. Suggest appropriate recollection if clinically indicated.   Report Status 08/25/2012 FINAL   Final      Studies: Mr Brain Wo Contrast  08/25/2012  *RADIOLOGY  REPORT*  Clinical Data: Syncope with diplopia and bilateral hand weakness.  MRI HEAD WITHOUT CONTRAST  Technique:  Multiplanar, multiecho pulse sequences of the brain and surrounding structures were obtained according to standard protocol without intravenous contrast.  Comparison: 08/23/2012.  Findings: There is no evidence for acute infarction, intracranial hemorrhage, mass lesion, hydrocephalus, or extra-axial fluid. There is no atrophy or white matter disease.  The major intracranial vascular structures are widely patent.  Calvarium and skull base are unremarkable.  No midline abnormalities.  Moderate nasopharyngeal adenoidal hypertrophy common in this age. Clear paranasal sinuses.  Minimal  right mastoid fluid likely effusion.  IMPRESSION: No acute or focal intracranial abnormality.  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   Original Report Authenticated By: Davonna Belling, M.D.     Scheduled Meds:   . cephALEXin  500 mg Oral QID  . potassium chloride  40 mEq Oral Once  . sodium chloride  3 mL Intravenous Q12H   Continuous Infusions:   . dextrose 5 % and 0.9% NaCl 125 mL/hr at 08/25/12 2359    Principal Problem:  *Syncope and collapse Active Problems:  Stuttering  Diplopia  UTI (lower urinary tract infection)    Time spent: 30 minutes    [facility] M  [facility]  If 8PM-8AM, please contact night-coverage at [email or web address], password [facility] 08/26/2012, 9:34 AM  LOS: 1 day

## 2012-08-26 NOTE — Progress Notes (Signed)
EEG completed.

## 2012-08-26 NOTE — Evaluation (Addendum)
Physical Therapy Evaluation Patient Details Name: Molly Beard MRN: [id number] DOB: Sep 07, 1986 Today's Date: 08/26/2012 Time: 1225-1259 PT Time Calculation (min): 34 min  PT Assessment / Plan / Recommendation Clinical Impression  pt admitted with reports of falls (unwitnessed), but of a syncopal nature.  After the second fall, pt now has a stutter and gait disturbance.  On eval,  Pt did display difficulties with gaze especially to the R associated with lightheadedness.  A few tests for vertigo produced the lightheadedness, but no nystagmus or spinning.  Muscle cocontraction  helps her present as weak and unstable during gait with gait instability worsening with scanning, but improving when just strolling in an "automatic" manner with graded assist slowly being taken away.  In Summary, I saw no definitive causes for her present problems, except some unexplained difficulties scanning, especially to the right-- which produced lightheadedness.  Hopefully this will resolve spontaneously, if not, an OP PT occulomotor, balance exam might be in order..  Will sign off at this time.  No further PT needs at this time.    PT Assessment  Patent does not need any further PT services    Follow Up Recommendations  No PT follow up    Does the patient have the potential to tolerate intense rehabilitation      Barriers to Discharge        Equipment Recommendations       Recommendations for Other Services     Frequency      Precautions / Restrictions Precautions Precautions: Fall   Pertinent Vitals/Pain       Mobility  Bed Mobility Bed Mobility: Supine to Sit;Sitting - Scoot to [place] of Bed;Sit to Supine Supine to Sit: 5: Supervision Sitting - Scoot to Edge of Bed: 5: Supervision Sit to Supine: 5: Supervision Details for Bed Mobility Assistance: slow and effortful as if trying too hard Transfers Transfers: Sit to Stand;Stand to Sit Sit to Stand: 4: Min guard Stand to Sit: 4: Min  guard Details for Transfer Assistance: effortful and uncoordinated, but no assist needed until standing Ambulation/Gait Ambulation/Gait Assistance: 4: Min assist Ambulation Distance (Feet): 200 Feet Assistive device: 1 person hand held assist Ambulation/Gait Assistance Details: guarded and halted gait with similar cocontractions of all flexors and extensors that improved as I had her continue without thinking about things and she slowly improved with assistance graded away as able Gait Pattern: Step-through pattern Stairs: No Wheelchair Mobility Wheelchair Mobility: No    Shoulder Instructions     Exercises     PT Diagnosis:    PT Problem List:   PT Treatment Interventions:     PT Goals    Visit Information  Last PT Received On: 08/26/12 Assistance Needed: +1    Subjective Data  Subjective: No, I don't want to look that way.Marland Kitchen.(scanning activity) Patient Stated Goal: Figure out what is wrong   Prior Functioning  Home Living Lives With: Significant other;Son;Daughter Available Help at Discharge: Family Type of Home: House Home Access: Stairs to enter Secretary/administrator of Steps: several Home Layout: One level Bathroom Shower/Tub: Engineer, manufacturing systems: Standard Home Adaptive Equipment: None Prior Function Level of Independence: Independent Able to Take Stairs?: Yes Driving: Yes Communication Communication: No difficulties;Other (comment) (stuttering)    Cognition  Overall Cognitive Status: Appears within functional limits for tasks assessed/performed Arousal/Alertness: Awake/alert Orientation Level: Appears intact for tasks assessed Behavior During Session: [facility] for tasks performed    Extremity/Trunk Assessment Right Upper Extremity Assessment RUE ROM/Strength/Tone: [facility] for tasks  assessed;Deficits RUE ROM/Strength/Tone Deficits: marked by cocontraction or flexors and extensor for upper and lower extremities Left Upper Extremity Assessment LUE  ROM/Strength/Tone: [facility] for tasks assessed;Deficits LUE ROM/Strength/Tone Deficits: see R UE Right Lower Extremity Assessment RLE ROM/Strength/Tone: WFL for tasks assessed;Deficits RLE ROM/Strength/Tone Deficits: marked by cocontraction or flexor and extensors which presents as weakness Left Lower Extremity Assessment LLE ROM/Strength/Tone: [facility] for tasks assessed;Deficits LLE ROM/Strength/Tone Deficits: See R LE Trunk Assessment Trunk Assessment: Normal   Balance Balance Balance Assessed: Yes Static Sitting Balance Static Sitting - Balance Support: Feet supported Static Sitting - Level of Assistance: 5: Stand by assistance  End of Session PT - End of Session Equipment Utilized During Treatment: Gait belt Activity Tolerance: Patient tolerated treatment well Patient left: in bed;with call bell/phone within reach;with family/visitor present Nurse Communication: Mobility status  GP Functional Assessment Tool Used: clinical judgement Functional Limitation: Mobility: Walking and moving around Mobility: Walking and Moving Around Current Status (Z6109): At least 1 percent but less than 20 percent impaired, limited or restricted Mobility: Walking and Moving Around Goal Status [phone]): At least 1 percent but less than 20 percent impaired, limited or restricted Mobility: Walking and Moving Around Discharge Status [phone]): At least 1 percent but less than 20 percent impaired, limited or restricted   Molly Beard, Molly Beard 08/26/2012, 1:39 PM  08/26/2012  Molly Beard, PT [phone] (pager)

## 2012-08-26 NOTE — Progress Notes (Signed)
Patient seen and examined by me.  Stuttering seems forced.  Await EEG, echo.  Will need to get PCP for follow up.  Possible D/C later today or tomm.  Marlin Canary DO

## 2012-08-26 NOTE — Care Management Note (Signed)
    Page 1 of 1   08/26/2012     3:05:37 PM   CARE MANAGEMENT NOTE 08/26/2012  Patient:  Molly Beard, Molly Beard   Account Number:  [id number]  Date Initiated:  08/26/2012  Documentation initiated by:  GRAVES-BIGELOW,Stanely Sexson  Subjective/Objective Assessment:   Pt admitted with syncope. Plan for d/c home today.     Action/Plan:   CM provided pt with a list of PCP'S in the community accepting new pt's.   Anticipated DC Date:  08/26/2012   Anticipated DC Plan:  HOME/SELF CARE         Choice offered to / List presented to:             Status of service:  Completed, signed off Medicare Important Message given?   (If response is "NO", the following Medicare IM given date fields will be blank) Date Medicare IM given:   Date Additional Medicare IM given:    Discharge Disposition:  HOME/SELF CARE  Per UR Regulation:  Reviewed for med. necessity/level of care/duration of stay  If discussed at Long Length of Stay Meetings, dates discussed:    Comments:

## 2012-08-26 NOTE — Progress Notes (Signed)
Clinical Social Work Department CLINICAL SOCIAL WORK PSYCHIATRY SERVICE LINE ASSESSMENT 08/26/2012  Patient:  Molly Beard  Account:  [id number]  Admit Date:  08/25/2012  Clinical Social Worker:  Unk Lightning, LCSW  Date/Time:  08/26/2012 02:45 PM Referred by:  Physician  Date referred:  08/26/2012 Reason for Referral  Behavioral Health Issues   Presenting Symptoms/Problems (In the person's/family's own words):   "I have been stuttering since I came to the hospital."   Abuse/Neglect/Trauma History (check all that apply)  Denies history   Abuse/Neglect/Trauma Comments:   Psychiatric History (check all that apply)  Denies history   Psychiatric medications:  None reported   Current Mental Health Hospitalizations/Previous Mental Health History:   None reported   Current provider:   N/A   Place and Date:   N/A   Current Medications:   ondansetron (ZOFRAN) IV, ondansetron                        . busPIRone  10 mg Oral TID  . cephALEXin  500 mg Oral QID  . [COMPLETED] potassium chloride  40 mEq Oral Once  . sodium chloride  3 mL Intravenous Q12H   Previous Impatient Admission/Date/Reason:   None reported   Emotional Health / Current Symptoms    Suicide/Self Harm  None reported   Suicide attempt in the past:   Other harmful behavior:   Psychotic/Dissociative Symptoms  None reported   Other Psychotic/Dissociative Symptoms:    Attention/Behavioral Symptoms  Within Normal Limits   Other Attention / Behavioral Symptoms:    Cognitive Impairment  Within Normal Limits   Other Cognitive Impairment:    Mood and Adjustment  Mood Congruent    Stress, Anxiety, Trauma, Any Recent Loss/Stressor  Other - See comment   Anxiety (frequency):   Phobia (specify):   Compulsive behavior (specify):   Obsessive behavior (specify):   Other:   Patient reports some additional stress due to holidays, low finances and "normal stress of being a mom"   Substance Abuse/Use   None   SBIRT completed (please refer for detailed history):  NA  Self-reported substance use:   Patient denies any substance use   Urinary Drug Screen Completed:  Y Alcohol level:   Negative screen    Environmental/Housing/Living Arrangement  With Family Member   Who is in the home:   Husband, son (68 years old) and dtr (67 years old)   Emergency contact:  Molly Beard   Financial  IPRS   Patient's Strengths and Goals (patient's own words):   "My family is good to me"   Clinical Social Worker's Interpretive Summary:   CSW received referral to complete psychosocial assessment. CSW reviewed chart and staffed case with psych MD. MD requests outpatient follow up dc.    CSW introduced myself and explained role. Patient sitting on edge of bed. No visitors present. Patient reports that she was not feeling well on Monday and passed out at [employer]. Patient reports that on Tuesday she was still not feeling well and when she woke up on Wednesday she had a stuttering problem and came to the hospital. Patient denies any MH or SA history. CSW inquired about any additional stress at home that could be related to medical problems. Patient reports that she has two children and a husband. Patient reports good relationship with husband and no concerns. Patient reports additional stress during the holidays and states that she is worried about buying presents for children and for  dtr's birthday. Patient states that this stress is not any increased stress to cause physical effects. Patient reports that family is supportive and helping while she is hospitalized but wants to dc home to see her children.  CSW discussed outpatient counseling which was recommended by psych MD. Patient reports that she does not have the extra money to spend on counseling. Patient feels she does not need therapy at this time. CSW placed community resources on AVS in case patient changes her mind.    Patient was appropriate during  the assessment. Patient was engaged with appropriate eye contact. Patient reports strong support system and states that she can manage any stress at home. Patient receptive to answering questions but does not allow much detail. Patient politely declined all resources. CSW updated RN on plan.   Disposition:  Psych Clinical Social Worker signing off

## 2012-08-26 NOTE — Consult Note (Signed)
Patient Identification:  Molly Beard Date of Evaluation:  08/26/2012 Reason for Consult:  Patterned Stutter  Referring Provider: Dr. Benjamine Mola  History of Present Illness:[place]ed.  She woke up in the ED.  She says she had no prodromal symptoms, had not taken any medication or other substances.  She denies any stressors.  She responds with stuttering her words.  She denies prior suttering.  She denies family history of stuttering.    Past Psychiatric History:She denies prior depression, suicidal thoughts/attempts, drug use, tobacco/cannabis use, denies alcohol use.  She has no history of seeing a therapist or psychiatrist.   Past Medical History:    History reviewed. No pertinent past medical history.     Past Surgical History  Procedure Date  . Tubal ligation     Allergies: No Known Allergies  Current Medications:  Prior to Admission medications   Medication Sig Start Date End Date Taking? Authorizing Provider  acetaminophen (TYLENOL) 500 MG tablet Take 1,000 mg by mouth every 6 (six) hours as needed. For pain   Yes Historical Provider, MD  cephALEXin (KEFLEX) 500 MG capsule Take 1 capsule (500 mg total) by mouth 4 (four) times daily. 08/23/12  Yes Carlyle Dolly, PA-C    Social History:    reports that she has been smoking.  She does not have any smokeless tobacco history on file. She reports that she does not drink alcohol or use illicit drugs.   Family History:    History reviewed. No pertinent family history.  Mental Status Examination/Evaluation: Objective:  Appearance: Casual  Eye Contact::  Good  Speech:  stutters  Volume:  Normal  Mood:  Placid,   Affect:  Congruent  Thought Process:  Coherent  Orientation:  Full (Time, Place, and Person)  Thought Content:  NA  Suicidal Thoughts:  No  Homicidal Thoughts:  No  Judgement:  Good  Insight:  Fair   DIAGNOSIS:   AXIS I  Syncopal episode, possible stress reaction R/O  Conversion Disorder  AXIS II  Deferred  AXIS III See medical notes.  AXIS IV economic problems, educational problems, housing problems, occupational problems, other psychosocial or environmental problems, problems related to social environment and new onset primary stuttering episode, stresses in her life  AXIS V 51-60 moderate symptoms   Assessment/Plan:  Discussed with Dr. Benjamine Mola, Psych CSW.  Pt appears very relaxed.  She agrees to have mother in law stay in room. She says she is married to [place] who lays Medical laboratory scientific officer learned from CSW he is currently unemployed and his mother is paying the bills.]  Mother in law laughs and says her son is like the 'third child' when pt says she has two children ages 70 and 13. She completed the 9th grade.  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  She grew up with mother, a brother and sister.  She has no comment or report of childhood abuse.     Collaboration with CSW reveals that pt minimizes her story as she stutters with MD.  CSW learns that she has a husband who is unemployed, a birthday party this Saturday and [funds?]  There may be stressors that she is unwilling to state in front of mother-in-law.      Pt says they are planning to do an EEG and she wants to go home.  Review of chart in afternoon does not report EEG, pending?   Discussed with Dr. Benjamine Mola.  There  is a possible conversion disorder that cannot be teased out at this time.  Pt with limited education and psychosocial situations that are beyond her control may be contributing factors. RECOMMENDATION:  1.  Pt has capacity and appears unconcerned that she began stuttering.  2.  Suggest buspirone 10 mg 3 times daily for anxiety, stress. 3.  Suggest referral to psychiatric services where she may receive supportive therapy.  4.  No further psychiatric needs.  MD Psychiatrist signs off Molly Hovanec MD 08/26/2012 11:14 AM

## 2012-08-26 NOTE — Discharge Summary (Signed)
Physician Discharge Summary  Molly Beard ZOX:[id number] DOB: [birth year] DOA: 08/25/2012  PCP: Sheila Oats, MD  Admit date: 08/25/2012 Discharge date: 08/26/2012  Time spent: 40 minutes  Recommendations for Outpatient Follow-up:  Discharge to home. Recommend follow up with OP BH services in 2-3 weeks.   Discharge Diagnoses:  Principal Problem:  *Syncope and collapse Active Problems:  Stuttering  Diplopia  UTI (lower urinary tract infection)   Discharge Condition: stable  Diet recommendation: regular  There were no vitals filed for this visit.  History of present illness:  Molly Beard is an 26 y.o. female with no prior psychiatric diagnosis, healthy otherwise on no chronic medication, presented to ER 3 days prior to 08/25/12 pressentation with a syncopal episode. She was subsequently discharged with plan to follow up with neurology, presented again to [facility] ER 08/25/12 with another syncope. Family reported no seizure activities, no bowel or bladder incontinence. She had no fever, chills, abdominal pain or cramps, nausea or vomiting. She has had no headache, but reported monocular diplopia. She also started to have intermittent stuttering. She reported not being depressed, anxious, or any family or social situation changes. She denied drug use, and doesn't smoke nor drink. Previous evaluation included a negative UDS, and neg urinary pregnancy test, and unremarkable serology. ER work up on this visit included unremarkable serology, and a negative MRI without contrast. She was seen in consultation with neurology in the ER who recommended that she be admitted for cardiac work up, and EEG, but her stuttering was felt to be non-physiological. Hospitalist was asked to admit her for these reasons   Hospital Course:  Syncope and collapse: no further episodes. Admitted to r/o significant cardiac dysrhythmias. Tele without event. EKG NSR. Echo yields EF 60%. Results of EEG normal. CT/MRI neg.  Seen by PT and no PT needs at discharge.   . Diplopia: seen by neuro in ED who opined no extraocular movement disorder. Pt reports improvement. EEG normal. See #1.  Marland Kitchen UTI (lower urinary tract infection) will continue antibiotic for 5 more days.  - Stuttering: nonphysiologic pattern per neuro. Inconsistent stuttering. BH consult recommends ant depressant. Will start buspar and provide resources for OP [facility] services.       Procedures  EEG  Consultations:  BH  Neuro  Discharge Exam: Filed Vitals:   08/25/12 1800 08/25/12 2003 08/25/12 2210 08/26/12 0500  BP:  116/72 106/69 96/61  Pulse:  78 65 66  Temp:  97.5 F (36.4 C) 98.7 F (37.1 C) 97.9 F (36.6 C)  TempSrc:  Oral Oral   Resp: 17 18  16   SpO2:  97% 97% 100%    General: awake alert  Cardiovascular: RRR No MGR No LEE Respiratory: normal effort BSCTAB No wheeze  Discharge Instructions  Discharge Orders    Future Orders Please Complete By Expires   Diet - low sodium heart healthy      Increase activity slowly      Discharge instructions      Comments:   Recommend follow up with OP psych services.   Call MD for:  persistant nausea and vomiting      Call MD for:  difficulty breathing, headache or visual disturbances          Medication List     As of 08/26/2012  2:47 PM    TAKE these medications         acetaminophen 500 MG tablet   Commonly known as: TYLENOL   Take 1,000 mg by mouth  every 6 (six) hours as needed. For pain      busPIRone 10 MG tablet   Commonly known as: BUSPAR   Take 1 tablet (10 mg total) by mouth 3 (three) times daily.      cephALEXin 500 MG capsule   Commonly known as: KEFLEX   Take 1 capsule (500 mg total) by mouth 4 (four) times daily.          The results of significant diagnostics from this hospitalization (including imaging, microbiology, ancillary and laboratory) are listed below for reference.    Significant Diagnostic Studies: Ct Head Wo Contrast  08/23/2012   *RADIOLOGY REPORT*  Clinical Data:  Syncopal episode earlier today.  Headache.  CT HEAD WITHOUT CONTRAST  Technique:  Contiguous axial images were obtained from the base of the skull through the vertex without contrast  Comparison:  None.  Findings:  The brain has a normal appearance without evidence for hemorrhage, acute infarction, hydrocephalus, or mass lesion.  There is no extra axial fluid collection.  The skull and paranasal sinuses are normal.  IMPRESSION: Normal CT of the head without contrast.   Original Report Authenticated By: Davonna Belling, M.D.    Mr Brain Wo Contrast  08/25/2012  *RADIOLOGY REPORT*  Clinical Data: Syncope with diplopia and bilateral hand weakness.  MRI HEAD WITHOUT CONTRAST  Technique:  Multiplanar, multiecho pulse sequences of the brain and surrounding structures were obtained according to standard protocol without intravenous contrast.  Comparison: 08/23/2012.  Findings: There is no evidence for acute infarction, intracranial hemorrhage, mass lesion, hydrocephalus, or extra-axial fluid. There is no atrophy or white matter disease.  The major intracranial vascular structures are widely patent.  Calvarium and skull base are unremarkable.  No midline abnormalities.  Moderate nasopharyngeal adenoidal hypertrophy common in this age. Clear paranasal sinuses.  Minimal right mastoid fluid likely effusion.  IMPRESSION: No acute or focal intracranial abnormality.  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   Original Report Authenticated By: Davonna Belling, M.D.     Microbiology: Recent Results (from the past 240 hour(s))  URINE CULTURE     Status: Normal   Collection Time   08/23/12  4:00 PM      Component Value Range Status Comment   Specimen Description URINE, CLEAN CATCH   Final    Special Requests NONE   Final    Culture  Setup Time 08/24/2012 03:07   Final    Colony Count 30,000 COLONIES/ML   Final    Culture     Final    Value:  Multiple bacterial morphotypes present, none predominant. Suggest appropriate recollection if clinically indicated.   Report Status 08/25/2012 FINAL   Final      Labs: Basic Metabolic Panel:  Lab 08/25/12 4098 08/23/12 1630  NA 141 139  K 3.4* 3.4*  CL 106 105  CO2 25 26  GLUCOSE 87 93  BUN 8 9  CREATININE 0.60 0.61  CALCIUM 9.6 9.4  MG -- --  PHOS -- --   Liver Function Tests:  Lab 08/25/12 1528 08/23/12 1630  AST 15 14  ALT 9 8  ALKPHOS 60 64  BILITOT 0.3 0.4  PROT 7.5 7.2  ALBUMIN 4.1 4.0   No results found for this basename: LIPASE:5,AMYLASE:5 in the last 168 hours No results found for this basename: AMMONIA:5 in the last 168 hours CBC:  Lab 08/25/12 1528 08/23/12 1630  WBC 4.2 11.6*  NEUTROABS 2.1 9.4*  HGB 13.2 13.2  HCT  38.1 36.7  MCV 86.8 85.5  PLT 242 253   Cardiac Enzymes: No results found for this basename: CKTOTAL:5,CKMB:5,CKMBINDEX:5,TROPONINI:5 in the last 168 hours BNP: BNP (last 3 results) No results found for this basename: PROBNP:3 in the last 8760 hours CBG:  Lab 08/25/12 1352  GLUCAP 80       Signed:  BLACK,KAREN M  [facility] 08/26/2012, 2:47 PM

## 2012-08-27 LAB — URINE CULTURE: Colony Count: NO GROWTH

## 2012-08-27 NOTE — Procedures (Signed)
EEG NUMBER:  [id number]  REFERRING PHYSICIAN:  Dr. Marlin Canary.  INDICATION FOR STUDY:  A 26 year old lady with recurrent episodes of loss of consciousness, likely syncope.  Study is being performed to rule out possible epileptic disorder.  DESCRIPTION:  This is a routine EEG recording performed during wakefulness and during brief sleep.  Predominant background activity during wakefulness consisted of 10 Hz symmetrical alpha rhythm recorded from the posterior head regions with good attenuation with eye opening. Low amplitude beta activity was recorded from the frontal and central regions.  Photic stimulation produced symmetrical occipital driving response.  Hyperventilation produced mild generalized symmetrical transient slowing response.  During brief sleep symmetrical vertex waves, sleep spindles were recorded.  No epileptiform discharges were recorded.  There were no areas of abnormal slowing.  INTERPRETATION:  This is a normal EEG during wakefulness and during light sleep.     Molly Christmas, MD    AV:WUJW D:  08/26/2012 11:33:14  T:  08/27/2012 00:57:10  Job #:  [id number]

## 2012-08-27 NOTE — Discharge Summary (Signed)
Patient seen and examined by me.  Plan to d/c as work up has not reveled a cause for patient's stuttering.  The stuttering has seemed to improve during the day.  Patient given information about PCP follow up.  Marlin Canary DO

## 2012-08-28 NOTE — ED Provider Notes (Signed)
History/physical exam/procedure(s) were performed by non-physician practitioner and as supervising physician I was immediately available for consultation/collaboration. I have reviewed all notes and am in agreement with care and plan.   Hilario Quarry, MD 08/28/12 [phone]

## 2013-02-09 ENCOUNTER — Encounter (HOSPITAL_COMMUNITY): Payer: Self-pay | Admitting: Physical Medicine and Rehabilitation

## 2013-02-09 ENCOUNTER — Emergency Department (HOSPITAL_COMMUNITY)
Admission: EM | Admit: 2013-02-09 | Discharge: 2013-02-09 | Disposition: A | Discharge status: Home / Self Care | Payer: Self-pay | Attending: Emergency Medicine | Admitting: Emergency Medicine

## 2013-02-09 DIAGNOSIS — K2990 Gastroduodenitis, unspecified, without bleeding: Secondary | ICD-10-CM | POA: Insufficient documentation

## 2013-02-09 DIAGNOSIS — Z3202 Encounter for pregnancy test, result negative: Secondary | ICD-10-CM | POA: Insufficient documentation

## 2013-02-09 DIAGNOSIS — Z87891 Personal history of nicotine dependence: Secondary | ICD-10-CM | POA: Insufficient documentation

## 2013-02-09 DIAGNOSIS — Z79899 Other long term (current) drug therapy: Secondary | ICD-10-CM | POA: Insufficient documentation

## 2013-02-09 DIAGNOSIS — R109 Unspecified abdominal pain: Secondary | ICD-10-CM | POA: Insufficient documentation

## 2013-02-09 DIAGNOSIS — K2970 Gastritis, unspecified, without bleeding: Secondary | ICD-10-CM | POA: Insufficient documentation

## 2013-02-09 DIAGNOSIS — R197 Diarrhea, unspecified: Secondary | ICD-10-CM | POA: Insufficient documentation

## 2013-02-09 DIAGNOSIS — R112 Nausea with vomiting, unspecified: Secondary | ICD-10-CM | POA: Insufficient documentation

## 2013-02-09 LAB — CBC WITH DIFFERENTIAL/PLATELET
Basophils Relative: 1 % (ref 0–1)
Eosinophils Absolute: 0.1 10*3/uL (ref 0.0–0.7)
Eosinophils Relative: 2 % (ref 0–5)
Hemoglobin: 13.7 g/dL (ref 12.0–15.0)
MCH: 30.1 pg (ref 26.0–34.0)
MCHC: 34.9 g/dL (ref 30.0–36.0)
Monocytes Relative: 7 % (ref 3–12)
Neutrophils Relative %: 52 % (ref 43–77)
Platelets: 245 10*3/uL (ref 150–400)

## 2013-02-09 LAB — URINALYSIS, ROUTINE W REFLEX MICROSCOPIC
Glucose, UA: NEGATIVE mg/dL
Ketones, ur: NEGATIVE mg/dL
Leukocytes, UA: NEGATIVE
Nitrite: NEGATIVE
Specific Gravity, Urine: 1.013 (ref 1.005–1.030)
pH: 8 (ref 5.0–8.0)

## 2013-02-09 LAB — COMPREHENSIVE METABOLIC PANEL
Albumin: 4 g/dL (ref 3.5–5.2)
Alkaline Phosphatase: 76 U/L (ref 39–117)
BUN: 6 mg/dL (ref 6–23)
Calcium: 9.6 mg/dL (ref 8.4–10.5)
Potassium: 3.5 mEq/L (ref 3.5–5.1)
Total Protein: 7.5 g/dL (ref 6.0–8.3)

## 2013-02-09 LAB — POCT PREGNANCY, URINE: Preg Test, Ur: NEGATIVE

## 2013-02-09 LAB — LIPASE, BLOOD: Lipase: 31 U/L (ref 11–59)

## 2013-02-09 MED ORDER — OXYCODONE-ACETAMINOPHEN 5-325 MG PO TABS: 1.0000 | ORAL_TABLET | Freq: Four times a day (QID) | ORAL | Status: DC | PRN

## 2013-02-09 MED ORDER — ONDANSETRON HCL 4 MG PO TABS: 4.0000 mg | ORAL_TABLET | Freq: Four times a day (QID) | ORAL | Status: DC

## 2013-02-09 MED ORDER — ONDANSETRON HCL 4 MG/2ML IJ SOLN
4.0000 mg | Freq: Once | INTRAMUSCULAR | Status: AC
  Administered 2013-02-09: 4 mg via INTRAVENOUS
  Filled 2013-02-09: qty 2

## 2013-02-09 MED ORDER — OXYCODONE-ACETAMINOPHEN 5-325 MG PO TABS
1.0000 | ORAL_TABLET | Freq: Once | ORAL | Status: AC
  Administered 2013-02-09: 1 via ORAL
  Filled 2013-02-09: qty 1

## 2013-02-09 MED ORDER — SODIUM CHLORIDE 0.9 % IV BOLUS (SEPSIS)
1000.0000 mL | Freq: Once | INTRAVENOUS | Status: AC
  Administered 2013-02-09: 1000 mL via INTRAVENOUS

## 2013-02-09 NOTE — ED Provider Notes (Signed)
History     CSN: [id number]  Arrival date & time 02/09/13  1218   First MD Initiated Contact with Patient 02/09/13 1418      Chief Complaint  Patient presents with  . Back Pain  . Nausea  . Abdominal Pain    (Consider location/radiation/quality/duration/timing/severity/associated sxs/prior treatment) HPI Comments: 27 y.o. Female with no significant medical hx presents today for left sided abdominal pain. Pt states onset yesterday when she felt her acid reflux acting up. Took her Zantac, but didn't help. Stomach felt tender on the left, "like a bunch of needles" on the left, radiated right. Subsided. Pain escalated at night. Ate apples, ate ice cream, drank lemon juice, drank baking soda, drank milk and orange juice. Didn't help. Sharp pain on left side and went through to her left flank. Pt slept in fetal position. Woke up in pain. Pain is 8/10 today. Pt states acid reflux is gone. Pain is mostly in her back right now, left flank, sharp, comes and goes, feels better when she is in a certain position.   No dysuria, hematuria, fevers, chest pain, shortness of breath.  Mild diarrhea and vomiting yesterday. No blood, no bile.  Today no diarrhea, normal bowel movement, and no vomiting.    Patient is a 27 y.o. female presenting with back pain and abdominal pain.  Back Pain Associated symptoms: abdominal pain   Associated symptoms: no chest pain, no dysuria, no fever, no headaches, no numbness, no pelvic pain and no weakness   Abdominal Pain Associated symptoms include abdominal pain. Pertinent negatives include no chest pain, diaphoresis, fever, headaches, nausea, neck pain, numbness, rash, vomiting or weakness.    No past medical history on file.  Past Surgical History  Procedure Laterality Date  . Tubal ligation      No family history on file.  History  Substance Use Topics  . Smoking status: Former Games developer  . Smokeless tobacco: Not on file  . Alcohol Use: No    OB History    Grav Para Term Preterm Abortions TAB SAB Ect Mult Living                  Review of Systems  Constitutional: Negative for fever and diaphoresis.  HENT: Negative for neck pain and neck stiffness.   Eyes: Negative for visual disturbance.  Respiratory: Negative for apnea, chest tightness and shortness of breath.   Cardiovascular: Negative for chest pain and palpitations.  Gastrointestinal: Positive for abdominal pain and diarrhea. Negative for nausea, vomiting and constipation.       LLQ  Genitourinary: Positive for flank pain. Negative for dysuria, hematuria, vaginal discharge and pelvic pain.       Left sided  Musculoskeletal: Negative for back pain and gait problem.  Skin: Negative for rash.  Neurological: Negative for dizziness, weakness, light-headedness, numbness and headaches.    Allergies  Review of patient's allergies indicates no known allergies.  Home Medications   Current Outpatient Rx  Name  Route  Sig  Dispense  Refill  . acetaminophen (TYLENOL) 500 MG tablet   Oral   Take 1,000 mg by mouth every 6 (six) hours as needed. For pain         . ibuprofen (ADVIL,MOTRIN) 600 MG tablet   Oral   Take 600 mg by mouth every 6 (six) hours as needed for pain.         . ranitidine (ZANTAC) 150 MG tablet   Oral   Take 300 mg by mouth 2 (  two) times daily as needed for heartburn.           BP 133/66  Pulse 86  Temp(Src) 98.2 F (36.8 C) (Oral)  Resp 16  SpO2 97%  Physical Exam  Nursing note and vitals reviewed. Constitutional: She is oriented to person, place, and time. She appears well-developed and well-nourished. No distress.  HENT:  Head: Normocephalic and atraumatic.  Eyes: Conjunctivae and EOM are normal.  Neck: Normal range of motion. Neck supple.  No meningeal signs  Cardiovascular: Normal rate, regular rhythm and normal heart sounds.  Exam reveals no gallop and no friction rub.   No murmur heard. Pulmonary/Chest: Effort normal and breath sounds  normal. No respiratory distress. She has no wheezes. She has no rales. She exhibits no tenderness.  Abdominal: Soft. Bowel sounds are normal. She exhibits no distension. There is tenderness. There is no rebound and no guarding.  Mild LLQ tenderness. No pain at McBurney's point, no Rovsing's sign.  Musculoskeletal: Normal range of motion. She exhibits no edema and no tenderness.  Neurological: She is alert and oriented to person, place, and time. No cranial nerve deficit.  Skin: Skin is warm and dry. She is not diaphoretic. No erythema.  Psychiatric: She has a normal mood and affect.    ED Course  Procedures (including critical care time)  Labs Reviewed  COMPREHENSIVE METABOLIC PANEL - Abnormal; Notable for the following:    Glucose, Bld 102 (*)    All other components within normal limits  CBC WITH DIFFERENTIAL  URINALYSIS, ROUTINE W REFLEX MICROSCOPIC  LIPASE, BLOOD  POCT PREGNANCY, URINE   No results found.   1. Gastritis       MDM  26 y.o. Female with no significant medical hx presents today for left sided abdominal pain. Patient is nontoxic, nonseptic appearing, in no apparent distress.  Patient's pain and nausea adequately managed in emergency department.  Fluid bolus given.  PE not concerning for surgical abdomen and there are no peritoneal signs.  No indication of appendicitis, bowel obstruction, bowel perforation, cholecystitis, diverticulitis, PID or ectopic pregnancy. Labs, imaging and vitals reviewed. All re-assuring and nothing to indicate an emergent condition.  Patient discharged home with symptomatic treatment and given strict instructions for follow-up with their primary care physician.  I have also discussed reasons to return immediately to the ER.  Patient expresses understanding and agrees with plan.        Glade Nurse, PA-C 02/09/13 1630

## 2013-02-09 NOTE — ED Notes (Signed)
Pt presents to department for evaluation of abdominal, and back pain. Also states nausea/vomiting. Onset yesterday. 8/10 pain at the time. Denies urinary symptoms. Last BM today was normal. Pt is alert and oriented x4.

## 2013-02-09 NOTE — ED Notes (Signed)
Unable to locate x 1  

## 2013-02-11 NOTE — ED Provider Notes (Signed)
Medical screening examination/treatment/procedure(s) were performed by non-physician practitioner and as supervising physician I was immediately available for consultation/collaboration.   Richardean Canal, MD 02/11/13 1539

## 2013-04-26 ENCOUNTER — Emergency Department (HOSPITAL_COMMUNITY)
Admission: EM | Admit: 2013-04-26 | Discharge: 2013-04-26 | Disposition: A | Discharge status: Home / Self Care | Payer: Self-pay | Attending: Emergency Medicine | Admitting: Emergency Medicine

## 2013-04-26 ENCOUNTER — Encounter (HOSPITAL_COMMUNITY): Payer: Self-pay | Admitting: *Deleted

## 2013-04-26 DIAGNOSIS — M545 Low back pain, unspecified: Secondary | ICD-10-CM | POA: Insufficient documentation

## 2013-04-26 DIAGNOSIS — Z87891 Personal history of nicotine dependence: Secondary | ICD-10-CM | POA: Insufficient documentation

## 2013-04-26 MED ORDER — CYCLOBENZAPRINE HCL 10 MG PO TABS: 10.0000 mg | ORAL_TABLET | Freq: Two times a day (BID) | ORAL | Status: DC | PRN

## 2013-04-26 MED ORDER — NAPROXEN 500 MG PO TABS: 500.0000 mg | ORAL_TABLET | Freq: Two times a day (BID) | ORAL | Status: DC

## 2013-04-26 MED ORDER — DIAZEPAM 5 MG PO TABS: 5.0000 mg | ORAL_TABLET | Freq: Once | ORAL | Status: DC

## 2013-04-26 MED ORDER — KETOROLAC TROMETHAMINE 60 MG/2ML IM SOLN
60.0000 mg | Freq: Once | INTRAMUSCULAR | Status: AC
  Administered 2013-04-26: 60 mg via INTRAMUSCULAR
  Filled 2013-04-26: qty 2

## 2013-04-26 NOTE — ED Provider Notes (Signed)
CSN: [id number]     Arrival date & time 04/26/13  1200 History     First MD Initiated Contact with Patient 04/26/13 1242     Chief Complaint  Patient presents with  . Back Pain   (Consider location/radiation/quality/duration/timing/severity/associated sxs/prior Treatment) HPI Comments: Patient is a 27 year old female who presents with sudden onset of lower back pain that started this morning when she woke up. The pain is located in her right lower back. The pain is aching and severe and does not radiate. The pain is constant. Movement makes the pain worse. Nothing makes the pain better. Patient has not tried anything for pain. No associated symptoms. No saddle paresthesias or bladder/bowel incontinence. Patient denies any injury.     Patient is a 27 y.o. female presenting with back pain.  Back Pain   History reviewed. No pertinent past medical history. Past Surgical History  Procedure Laterality Date  . Tubal ligation     No family history on file. History  Substance Use Topics  . Smoking status: Former Games developer  . Smokeless tobacco: Not on file  . Alcohol Use: No   OB History   Grav Para Term Preterm Abortions TAB SAB Ect Mult Living                 Review of Systems  Musculoskeletal: Positive for back pain.  All other systems reviewed and are negative.    Allergies  Review of patient's allergies indicates no known allergies.  Home Medications   Current Outpatient Rx  Name  Route  Sig  Dispense  Refill  . ibuprofen (ADVIL,MOTRIN) 200 MG tablet   Oral   Take 800 mg by mouth every 6 (six) hours as needed for pain.         . ranitidine (ZANTAC) 150 MG tablet   Oral   Take 300 mg by mouth 2 (two) times daily as needed for heartburn.          BP 125/73  Pulse 74  Temp(Src) 98.4 F (36.9 C) (Oral)  Resp 18  SpO2 97% Physical Exam  Nursing note and vitals reviewed. Constitutional: She appears well-developed and well-nourished. No distress.  HENT:  Head:  Normocephalic and atraumatic.  Eyes: Conjunctivae and EOM are normal.  Neck: Normal range of motion.  Cardiovascular: Normal rate and regular rhythm.  Exam reveals no gallop and no friction rub.   No murmur heard. Pulmonary/Chest: Effort normal and breath sounds normal. She has no wheezes. She has no rales. She exhibits no tenderness.  Abdominal: Soft. She exhibits no distension. There is no tenderness. There is no rebound.  Musculoskeletal: Normal range of motion.  Right paraspinal lumbar tenderness to palpation. No midline tenderness to palpation.   Neurological: She is alert.  Lower extremity strength and sensation equal and intact bilaterally. Speech is goal-oriented. Moves limbs without ataxia.   Skin: Skin is warm and dry.  Psychiatric: She has a normal mood and affect. Her behavior is normal.    ED Course   Procedures (including critical care time)  Labs Reviewed - No data to display No results found.  1. Low back pain     MDM  12:59 PM Patient will have IM toradol. Back pain likely muscular strain. Patient will be discharged with naprosyn and flexeril. Patient instructed to apply heat to affected area. No midline spine tenderness to palpation. No injury. No plain films indicated at this time. Vitals stable and patient afebrile.   Emilia Beck, PA-C 04/26/13  1304 

## 2013-04-26 NOTE — ED Notes (Signed)
Pt states that she woke up with Left lower back pain this am that only hurts with movement.  No abdominal pain, urinary symptoms, no  Different vag discharge.   No nvd

## 2013-04-27 NOTE — ED Provider Notes (Signed)
Medical screening examination/treatment/procedure(s) were performed by non-physician practitioner and as supervising physician I was immediately available for consultation/collaboration.   Laray Anger, DO 04/27/13 2218

## 2013-06-04 ENCOUNTER — Encounter (HOSPITAL_COMMUNITY): Payer: Self-pay | Admitting: *Deleted

## 2013-06-04 ENCOUNTER — Emergency Department (HOSPITAL_COMMUNITY)
Admission: EM | Admit: 2013-06-04 | Discharge: 2013-06-04 | Disposition: A | Discharge status: Home / Self Care | Payer: Self-pay | Attending: Emergency Medicine | Admitting: Emergency Medicine

## 2013-06-04 DIAGNOSIS — Z87891 Personal history of nicotine dependence: Secondary | ICD-10-CM | POA: Insufficient documentation

## 2013-06-04 DIAGNOSIS — Z3202 Encounter for pregnancy test, result negative: Secondary | ICD-10-CM | POA: Insufficient documentation

## 2013-06-04 DIAGNOSIS — R300 Dysuria: Secondary | ICD-10-CM | POA: Insufficient documentation

## 2013-06-04 DIAGNOSIS — Z79899 Other long term (current) drug therapy: Secondary | ICD-10-CM | POA: Insufficient documentation

## 2013-06-04 DIAGNOSIS — R109 Unspecified abdominal pain: Secondary | ICD-10-CM | POA: Insufficient documentation

## 2013-06-04 DIAGNOSIS — R3915 Urgency of urination: Secondary | ICD-10-CM | POA: Insufficient documentation

## 2013-06-04 DIAGNOSIS — R350 Frequency of micturition: Secondary | ICD-10-CM | POA: Insufficient documentation

## 2013-06-04 LAB — CBC WITH DIFFERENTIAL/PLATELET
Basophils Relative: 0 % (ref 0–1)
HCT: 36.2 % (ref 36.0–46.0)
Hemoglobin: 13 g/dL (ref 12.0–15.0)
Lymphs Abs: 1.9 10*3/uL (ref 0.7–4.0)
MCH: 30.9 pg (ref 26.0–34.0)
MCHC: 35.9 g/dL (ref 30.0–36.0)
Monocytes Absolute: 0.4 10*3/uL (ref 0.1–1.0)
Monocytes Relative: 7 % (ref 3–12)
Neutro Abs: 3.6 10*3/uL (ref 1.7–7.7)
Neutrophils Relative %: 59 % (ref 43–77)
RBC: 4.21 MIL/uL (ref 3.87–5.11)

## 2013-06-04 LAB — URINALYSIS, ROUTINE W REFLEX MICROSCOPIC
Ketones, ur: NEGATIVE mg/dL
Leukocytes, UA: NEGATIVE
Nitrite: NEGATIVE
Protein, ur: NEGATIVE mg/dL
Urobilinogen, UA: 0.2 mg/dL (ref 0.0–1.0)

## 2013-06-04 LAB — COMPREHENSIVE METABOLIC PANEL
ALT: 18 U/L (ref 0–35)
AST: 19 U/L (ref 0–37)
Calcium: 8.8 mg/dL (ref 8.4–10.5)
Creatinine, Ser: 0.81 mg/dL (ref 0.50–1.10)
GFR calc Af Amer: >90 mL/min (ref >90)
GFR calc non Af Amer: >90 mL/min (ref >90)
Sodium: 137 mEq/L (ref 135–145)
Total Protein: 7.2 g/dL (ref 6.0–8.3)

## 2013-06-04 LAB — URINE MICROSCOPIC-ADD ON

## 2013-06-04 MED ORDER — PHENAZOPYRIDINE HCL 100 MG PO TABS
200.0000 mg | ORAL_TABLET | Freq: Once | ORAL | Status: AC
  Administered 2013-06-04: 200 mg via ORAL
  Filled 2013-06-04: qty 2

## 2013-06-04 MED ORDER — NITROFURANTOIN MONOHYD MACRO 100 MG PO CAPS: 100.0000 mg | ORAL_CAPSULE | Freq: Two times a day (BID) | ORAL | Status: DC

## 2013-06-04 MED ORDER — HYDROCODONE-ACETAMINOPHEN 5-325 MG PO TABS: 1.0000 | ORAL_TABLET | ORAL | Status: DC | PRN

## 2013-06-04 MED ORDER — PHENAZOPYRIDINE HCL 200 MG PO TABS: 200.0000 mg | ORAL_TABLET | Freq: Three times a day (TID) | ORAL | Status: DC

## 2013-06-04 NOTE — ED Notes (Signed)
Pt given cup and asked for urine sample.

## 2013-06-04 NOTE — ED Notes (Signed)
Woke up this am and could not urinate.  Pt states now lower abdomen is hurting and she is feeling nauseated.  LMP August

## 2013-06-04 NOTE — ED Provider Notes (Signed)
CSN: [id number]     Arrival date & time 06/04/13  1212 History   First MD Initiated Contact with Patient 06/04/13 1234     No chief complaint on file.   HPI  27 year old female. Awake this morning feeling like she needed to urinate. Has been trying to hydrate herself to see if it would resolve. Marland Kitchen Keeps going to  to the restroom with a sensation of urgency and frequency. Has very small amounts of urine output. The story that "my bladder was full". No fevers no chills no back pain no nausea no vomiting denies pregnancy.  Last menstrual period was one week ago.  History reviewed. No pertinent past medical history. Past Surgical History  Procedure Laterality Date  . Tubal ligation    . Tubal ligation     No family history on file. History  Substance Use Topics  . Smoking status: Former Games developer  . Smokeless tobacco: Not on file  . Alcohol Use: No   OB History   Grav Para Term Preterm Abortions TAB SAB Ect Mult Living                 Review of Systems  Constitutional: Negative for fever, chills, diaphoresis, appetite change and fatigue.  HENT: Negative for sore throat, mouth sores and trouble swallowing.   Eyes: Negative for visual disturbance.  Respiratory: Negative for cough, chest tightness, shortness of breath and wheezing.   Cardiovascular: Negative for chest pain.  Gastrointestinal: Positive for abdominal pain. Negative for nausea, vomiting, diarrhea and abdominal distention.  Endocrine: Negative for polydipsia, polyphagia and polyuria.  Genitourinary: Positive for dysuria, urgency and frequency. Negative for hematuria and pelvic pain.  Musculoskeletal: Negative for gait problem.  Skin: Negative for color change, pallor and rash.  Neurological: Negative for dizziness, syncope, light-headedness and headaches.  Hematological: Does not bruise/bleed easily.  Psychiatric/Behavioral: Negative for behavioral problems and confusion.    Allergies  Review of patient's allergies  indicates no known allergies.  Home Medications   Current Outpatient Rx  Name  Route  Sig  Dispense  Refill  . ibuprofen (ADVIL,MOTRIN) 200 MG tablet   Oral   Take 800 mg by mouth every 4 (four) hours as needed for pain.          . naproxen (NAPROSYN) 500 MG tablet   Oral   Take 1 tablet (500 mg total) by mouth 2 (two) times daily with a meal.   30 tablet   0   . ranitidine (ZANTAC) 150 MG tablet   Oral   Take 300 mg by mouth 2 (two) times daily as needed for heartburn.         Marland Kitchen HYDROcodone-acetaminophen (NORCO/VICODIN) 5-325 MG per tablet   Oral   Take 1 tablet by mouth every 4 (four) hours as needed for pain.   10 tablet   0   . nitrofurantoin, macrocrystal-monohydrate, (MACROBID) 100 MG capsule   Oral   Take 1 capsule (100 mg total) by mouth 2 (two) times daily.   10 capsule   0   . phenazopyridine (PYRIDIUM) 200 MG tablet   Oral   Take 1 tablet (200 mg total) by mouth 3 (three) times daily.   20 tablet   0    BP 118/68  Pulse 85  Temp(Src) 97.9 F (36.6 C) (Oral)  Resp 18  SpO2 99% Physical Exam  Constitutional: She is oriented to person, place, and time. She appears well-developed and well-nourished. No distress.  HENT:  Head: Normocephalic.  Eyes: Conjunctivae are normal. Pupils are equal, round, and reactive to light. No scleral icterus.  Neck: Normal range of motion. Neck supple. No thyromegaly present.  Cardiovascular: Normal rate and regular rhythm.  Exam reveals no gallop and no friction rub.   No murmur heard. Pulmonary/Chest: Effort normal and breath sounds normal. No respiratory distress. She has no wheezes. She has no rales.  Abdominal: Soft. Bowel sounds are normal. She exhibits no distension. There is tenderness in the suprapubic area. There is no rebound.  Minimal supra-pubic tenderness. No flank tenderness. Limited bedside ultrasound shows no distention of the urinary bladder.  Musculoskeletal: Normal range of motion.  Neurological: She  is alert and oriented to person, place, and time.  Skin: Skin is warm and dry. No rash noted.  Psychiatric: She has a normal mood and affect. Her behavior is normal.    ED Course  Procedures (including critical care time) Labs Review Labs Reviewed  URINALYSIS, ROUTINE W REFLEX MICROSCOPIC - Abnormal; Notable for the following:    APPearance HAZY (*)    Specific Gravity, Urine 1.003 (*)    Hgb urine dipstick TRACE (*)    All other components within normal limits  URINE CULTURE  COMPREHENSIVE METABOLIC PANEL  CBC WITH DIFFERENTIAL  PREGNANCY, URINE  URINE MICROSCOPIC-ADD ON   Imaging Review No results found.  MDM  No diagnosis found. Per bedside ultrasound or retention. Her symptoms are most consistent with cystitis. UA and urine pregnancy are pending.  Pregnancy test is negative. Urine is really minimally depressed and is quite dilute. She is rather insistent that she does not on pelvic exam. Her abdomen is quite benign to exam and. I did reexamine her and repeated an ultrasound. She has no peritoneal irritation. Ultrasound shows no distention of her bladder. This point the plan will be treatment with Macrobid, Pyridium, limited number of Vicodin. Recheck with any worsening or changing symptoms await culture results.    Roney Marion, MD 06/04/13 [phone]

## 2013-06-05 LAB — URINE CULTURE

## 2013-12-06 ENCOUNTER — Encounter (HOSPITAL_COMMUNITY): Payer: Self-pay | Admitting: Emergency Medicine

## 2013-12-06 ENCOUNTER — Emergency Department (HOSPITAL_COMMUNITY)
Admission: EM | Admit: 2013-12-06 | Discharge: 2013-12-06 | Disposition: A | Discharge status: Home / Self Care | Payer: Self-pay | Attending: Emergency Medicine | Admitting: Emergency Medicine

## 2013-12-06 DIAGNOSIS — Z3202 Encounter for pregnancy test, result negative: Secondary | ICD-10-CM | POA: Insufficient documentation

## 2013-12-06 DIAGNOSIS — R109 Unspecified abdominal pain: Secondary | ICD-10-CM | POA: Insufficient documentation

## 2013-12-06 DIAGNOSIS — Z87891 Personal history of nicotine dependence: Secondary | ICD-10-CM | POA: Insufficient documentation

## 2013-12-06 DIAGNOSIS — Z9851 Tubal ligation status: Secondary | ICD-10-CM | POA: Insufficient documentation

## 2013-12-06 DIAGNOSIS — N72 Inflammatory disease of cervix uteri: Secondary | ICD-10-CM | POA: Insufficient documentation

## 2013-12-06 LAB — CBC WITH DIFFERENTIAL/PLATELET
Basophils Absolute: 0 10*3/uL (ref 0.0–0.1)
Basophils Relative: 1 % (ref 0–1)
EOS ABS: 0.1 10*3/uL (ref 0.0–0.7)
Eosinophils Relative: 3 % (ref 0–5)
HCT: 40.8 % (ref 36.0–46.0)
Hemoglobin: 14.1 g/dL (ref 12.0–15.0)
LYMPHS PCT: 42 % (ref 12–46)
Lymphs Abs: 1.6 10*3/uL (ref 0.7–4.0)
MCH: 30.5 pg (ref 26.0–34.0)
MCHC: 34.6 g/dL (ref 30.0–36.0)
MCV: 88.3 fL (ref 78.0–100.0)
Monocytes Absolute: 0.3 10*3/uL (ref 0.1–1.0)
Monocytes Relative: 8 % (ref 3–12)
NEUTROS PCT: 46 % (ref 43–77)
Neutro Abs: 1.7 10*3/uL (ref 1.7–7.7)
PLATELETS: 267 10*3/uL (ref 150–400)
RBC: 4.62 MIL/uL (ref 3.87–5.11)
RDW: 13 % (ref 11.5–15.5)
WBC: 3.8 10*3/uL — AB (ref 4.0–10.5)

## 2013-12-06 LAB — BASIC METABOLIC PANEL
BUN: 8 mg/dL (ref 6–23)
CALCIUM: 9.5 mg/dL (ref 8.4–10.5)
CO2: 24 meq/L (ref 19–32)
Chloride: 103 mEq/L (ref 96–112)
Creatinine, Ser: 0.69 mg/dL (ref 0.50–1.10)
GFR calc Af Amer: >90 mL/min (ref >90)
Glucose, Bld: 83 mg/dL (ref 70–99)
POTASSIUM: 3.7 meq/L (ref 3.7–5.3)
SODIUM: 141 meq/L (ref 137–147)

## 2013-12-06 LAB — URINE MICROSCOPIC-ADD ON

## 2013-12-06 LAB — URINALYSIS, ROUTINE W REFLEX MICROSCOPIC
BILIRUBIN URINE: NEGATIVE
Glucose, UA: NEGATIVE mg/dL
Ketones, ur: NEGATIVE mg/dL
Nitrite: NEGATIVE
PH: 6 (ref 5.0–8.0)
Protein, ur: NEGATIVE mg/dL
Specific Gravity, Urine: 1.029 (ref 1.005–1.030)
UROBILINOGEN UA: 0.2 mg/dL (ref 0.0–1.0)

## 2013-12-06 LAB — POC URINE PREG, ED: PREG TEST UR: NEGATIVE

## 2013-12-06 LAB — WET PREP, GENITAL
CLUE CELLS WET PREP: NONE SEEN
Trich, Wet Prep: NONE SEEN
Yeast Wet Prep HPF POC: NONE SEEN

## 2013-12-06 LAB — HIV ANTIBODY (ROUTINE TESTING W REFLEX): HIV: NONREACTIVE

## 2013-12-06 MED ORDER — HYDROCODONE-ACETAMINOPHEN 5-325 MG PO TABS: 1.0000 | ORAL_TABLET | ORAL | Status: DC | PRN

## 2013-12-06 MED ORDER — CEFTRIAXONE SODIUM 250 MG IJ SOLR
250.0000 mg | Freq: Once | INTRAMUSCULAR | Status: AC
  Administered 2013-12-06: 250 mg via INTRAMUSCULAR
  Filled 2013-12-06: qty 250

## 2013-12-06 MED ORDER — IBUPROFEN 800 MG PO TABS
800.0000 mg | ORAL_TABLET | Freq: Once | ORAL | Status: AC
  Administered 2013-12-06: 800 mg via ORAL
  Filled 2013-12-06: qty 1

## 2013-12-06 MED ORDER — AZITHROMYCIN 250 MG PO TABS
1000.0000 mg | ORAL_TABLET | Freq: Once | ORAL | Status: AC
  Administered 2013-12-06: 1000 mg via ORAL
  Filled 2013-12-06: qty 4

## 2013-12-06 MED ORDER — DOXYCYCLINE HYCLATE 100 MG PO CAPS: 100.0000 mg | ORAL_CAPSULE | Freq: Two times a day (BID) | ORAL | Status: DC

## 2013-12-06 NOTE — ED Notes (Signed)
Pelvic cart to room 

## 2013-12-06 NOTE — ED Notes (Signed)
Pt woke up this am with pain to vaginal area, pain with urination and vaginal discharge.

## 2013-12-06 NOTE — ED Provider Notes (Signed)
CSN: [id number]     Arrival date & time 12/06/13  0714 History   First MD Initiated Contact with Patient 12/06/13 [phone]     Chief Complaint  Patient presents with  . Vaginal Discharge     (Consider location/radiation/quality/duration/timing/severity/associated sxs/prior Treatment) Patient is a 28 y.o. female presenting with vaginal discharge. The history is provided by the patient. No language interpreter was used.  Vaginal Discharge Associated symptoms: abdominal pain and dysuria   Associated symptoms: no fever, no nausea and no vomiting   Associated symptoms comment:  She complains of vaginal pain, dysuria and vaginal discharge that started overnight. She now is having abdominal discomfort extending to right greater than left, and to right flank. No fever, nausea or vomiting.   History reviewed. No pertinent past medical history. Past Surgical History  Procedure Laterality Date  . Tubal ligation    . Tubal ligation     History reviewed. No pertinent family history. History  Substance Use Topics  . Smoking status: Former Games developer  . Smokeless tobacco: Not on file  . Alcohol Use: No   OB History   Grav Para Term Preterm Abortions TAB SAB Ect Mult Living                 Review of Systems  Constitutional: Negative for fever and chills.  Gastrointestinal: Positive for abdominal pain. Negative for nausea and vomiting.  Genitourinary: Positive for dysuria, flank pain, vaginal discharge and vaginal pain. Negative for vaginal bleeding.  Musculoskeletal: Negative.   Skin: Negative.   Neurological: Negative.       Allergies  Review of patient's allergies indicates no known allergies.  Home Medications   Current Outpatient Rx  Name  Route  Sig  Dispense  Refill  . ibuprofen (ADVIL,MOTRIN) 200 MG tablet   Oral   Take 800 mg by mouth every 4 (four) hours as needed for moderate pain.          . ranitidine (ZANTAC) 150 MG tablet   Oral   Take 300 mg by mouth 2 (two) times  daily as needed for heartburn.          BP 114/56  Pulse 67  Temp(Src) 97.7 F (36.5 C) (Oral)  Resp 18  Wt 140 lb (63.504 kg)  SpO2 100%  LMP 11/13/2013 Physical Exam  Constitutional: She is oriented to person, place, and time. She appears well-developed and well-nourished.  HENT:  Head: Normocephalic.  Neck: Normal range of motion. Neck supple.  Cardiovascular: Normal rate and regular rhythm.   Pulmonary/Chest: Effort normal and breath sounds normal.  Abdominal: Soft. Bowel sounds are normal. There is no tenderness. There is no rebound and no guarding.  Genitourinary: Uterus normal. Vaginal discharge found.  No adnexal mass. There is bilateral adnexal and cervical tenderness. Yellowish green cervical discharge present.   Musculoskeletal: Normal range of motion.  Neurological: She is alert and oriented to person, place, and time.  Skin: Skin is warm and dry. No rash noted.  Psychiatric: She has a normal mood and affect.    ED Course  Procedures (including critical care time) Labs Review Labs Reviewed  URINALYSIS, ROUTINE W REFLEX MICROSCOPIC - Abnormal; Notable for the following:    APPearance HAZY (*)    Hgb urine dipstick SMALL (*)    Leukocytes, UA TRACE (*)    All other components within normal limits  URINE MICROSCOPIC-ADD ON - Abnormal; Notable for the following:    Squamous Epithelial / LPF FEW (*)  Bacteria, UA FEW (*)    All other components within normal limits  WET PREP, GENITAL  GC/CHLAMYDIA PROBE AMP  CBC WITH DIFFERENTIAL  BASIC METABOLIC PANEL  HIV ANTIBODY (ROUTINE TESTING)  POC URINE PREG, ED   Results for orders placed during the hospital encounter of 12/06/13  WET PREP, GENITAL      Result Value Ref Range   Yeast Wet Prep HPF POC NONE SEEN  NONE SEEN   Trich, Wet Prep NONE SEEN  NONE SEEN   Clue Cells Wet Prep HPF POC NONE SEEN  NONE SEEN   WBC, Wet Prep HPF POC MANY (*) NONE SEEN  URINALYSIS, ROUTINE W REFLEX MICROSCOPIC      Result  Value Ref Range   Color, Urine YELLOW  YELLOW   APPearance HAZY (*) CLEAR   Specific Gravity, Urine 1.029  1.005 - 1.030   pH 6.0  5.0 - 8.0   Glucose, UA NEGATIVE  NEGATIVE mg/dL   Hgb urine dipstick SMALL (*) NEGATIVE   Bilirubin Urine NEGATIVE  NEGATIVE   Ketones, ur NEGATIVE  NEGATIVE mg/dL   Protein, ur NEGATIVE  NEGATIVE mg/dL   Urobilinogen, UA 0.2  0.0 - 1.0 mg/dL   Nitrite NEGATIVE  NEGATIVE   Leukocytes, UA TRACE (*) NEGATIVE  URINE MICROSCOPIC-ADD ON      Result Value Ref Range   Squamous Epithelial / LPF FEW (*) RARE   WBC, UA 3-6  <3 WBC/hpf   RBC / HPF 3-6  <3 RBC/hpf   Bacteria, UA FEW (*) RARE   Urine-Other MUCOUS PRESENT    CBC WITH DIFFERENTIAL      Result Value Ref Range   WBC 3.8 (*) 4.0 - 10.5 K/uL   RBC 4.62  3.87 - 5.11 MIL/uL   Hemoglobin 14.1  12.0 - 15.0 g/dL   HCT 16.140.8  09.636.0 - 04.546.0 %   MCV 88.3  78.0 - 100.0 fL   MCH 30.5  26.0 - 34.0 pg   MCHC 34.6  30.0 - 36.0 g/dL   RDW 40.913.0  81.111.5 - 91.415.5 %   Platelets 267  150 - 400 K/uL   Neutrophils Relative % 46  43 - 77 %   Neutro Abs 1.7  1.7 - 7.7 K/uL   Lymphocytes Relative 42  12 - 46 %   Lymphs Abs 1.6  0.7 - 4.0 K/uL   Monocytes Relative 8  3 - 12 %   Monocytes Absolute 0.3  0.1 - 1.0 K/uL   Eosinophils Relative 3  0 - 5 %   Eosinophils Absolute 0.1  0.0 - 0.7 K/uL   Basophils Relative 1  0 - 1 %   Basophils Absolute 0.0  0.0 - 0.1 K/uL  BASIC METABOLIC PANEL      Result Value Ref Range   Sodium 141  137 - 147 mEq/L   Potassium 3.7  3.7 - 5.3 mEq/L   Chloride 103  96 - 112 mEq/L   CO2 24  19 - 32 mEq/L   Glucose, Bld 83  70 - 99 mg/dL   BUN 8  6 - 23 mg/dL   Creatinine, Ser 7.820.69  0.50 - 1.10 mg/dL   Calcium 9.5  8.4 - 95.610.5 mg/dL   GFR calc non Af Amer >90  >90 mL/min   GFR calc Af Amer >90  >90 mL/min  POC URINE PREG, ED      Result Value Ref Range   Preg Test, Ur NEGATIVE  NEGATIVE  Imaging Review No results found.   EKG Interpretation None      MDM   Final diagnoses:   None    1. Cervicitis  Patient with symptomatic vaginal leukorrhea, no WBC count, afebrile. Ultrasound considered but not felt warranted given no fever or leukocytosis. Treated with Azithromycin, Rocephin and 7 days of Doxycycline. Referred to gynecology.    Arnoldo Hooker, PA-C 12/06/13 1130

## 2013-12-06 NOTE — Discharge Instructions (Signed)
Cervicitis °Cervicitis is a soreness and swelling (inflammation) of the cervix. Your cervix is located at the bottom of your uterus. It opens up to the vagina. °CAUSES  °· Sexually transmitted infections (STIs).   °· Allergic reaction.   °· Medicines or birth control devices that are put in the vagina.   °· Injury to the cervix.   °· Bacterial infections.   °RISK FACTORS °You are at greater risk if you: °· Have unprotected sexual intercourse. °· Have sexual intercourse with many partners. °· Began sexual intercourse at an early age. °· Have a history of STIs. °SYMPTOMS  °There may be no symptoms. If symptoms occur, they may include:  °· Grey, white, yellow, or bad-smelling vaginal discharge.   °· Pain or itching of the area outside the vagina.   °· Painful sexual intercourse.   °· Lower abdominal or lower back pain, especially during intercourse.   °· Frequent urination.   °· Abnormal vaginal bleeding between periods, after sexual intercourse, or after menopause.   °· Pressure or a heavy feeling in the pelvis.   °DIAGNOSIS  °Diagnosis is made after a pelvic exam. Other tests may include:  °· Examination of any discharge under a microscope (wet prep).   °· A Pap test.   °TREATMENT  °Treatment will depend on the cause of cervicitis. If it is caused by an STI, both you and your partner will need to be treated. Antibiotic medicines will be given.  °HOME CARE INSTRUCTIONS  °· Do not have sexual intercourse until your health care provider says it is okay.   °· Do not have sexual intercourse until your partner has been treated, if your cervicitis is caused by an STI.   °· Take your antibiotics as directed. Finish them even if you start to feel better.   °SEEK MEDICAL CARE IF: °· Your symptoms come back.   °· You have a fever.   °MAKE SURE YOU:  °· Understand these instructions. °· Will watch your condition. °· Will get help right away if you are not doing well or get worse. °Document Released: 09/01/2005 Document Revised:  05/04/2013 Document Reviewed: 02/23/2013 °[employer]® Patient Information ©2014 [employer]. ° °

## 2013-12-06 NOTE — ED Notes (Signed)
Pt undressed and in gown at this time

## 2013-12-07 LAB — GC/CHLAMYDIA PROBE AMP
CT PROBE, AMP APTIMA: NEGATIVE
GC Probe RNA: NEGATIVE

## 2013-12-07 NOTE — ED Provider Notes (Signed)
Medical screening examination/treatment/procedure(s) were performed by non-physician practitioner and as supervising physician I was immediately available for consultation/collaboration.   EKG Interpretation None       Ladainian Therien, MD 12/07/13 1234 

## 2014-05-06 ENCOUNTER — Emergency Department (HOSPITAL_COMMUNITY)
Admission: EM | Admit: 2014-05-06 | Discharge: 2014-05-06 | Disposition: A | Discharge status: Home / Self Care | Payer: Self-pay | Attending: Emergency Medicine | Admitting: Emergency Medicine

## 2014-05-06 ENCOUNTER — Encounter (HOSPITAL_COMMUNITY): Payer: Self-pay | Admitting: Emergency Medicine

## 2014-05-06 DIAGNOSIS — Z792 Long term (current) use of antibiotics: Secondary | ICD-10-CM | POA: Insufficient documentation

## 2014-05-06 DIAGNOSIS — M25521 Pain in right elbow: Secondary | ICD-10-CM

## 2014-05-06 DIAGNOSIS — Y9389 Activity, other specified: Secondary | ICD-10-CM | POA: Insufficient documentation

## 2014-05-06 DIAGNOSIS — X503XXA Overexertion from repetitive movements, initial encounter: Secondary | ICD-10-CM | POA: Insufficient documentation

## 2014-05-06 DIAGNOSIS — Y9289 Other specified places as the place of occurrence of the external cause: Secondary | ICD-10-CM | POA: Insufficient documentation

## 2014-05-06 DIAGNOSIS — S59919A Unspecified injury of unspecified forearm, initial encounter: Principal | ICD-10-CM

## 2014-05-06 DIAGNOSIS — S59909A Unspecified injury of unspecified elbow, initial encounter: Secondary | ICD-10-CM | POA: Insufficient documentation

## 2014-05-06 DIAGNOSIS — IMO0002 Reserved for concepts with insufficient information to code with codable children: Secondary | ICD-10-CM | POA: Insufficient documentation

## 2014-05-06 DIAGNOSIS — M5412 Radiculopathy, cervical region: Secondary | ICD-10-CM

## 2014-05-06 DIAGNOSIS — S6990XA Unspecified injury of unspecified wrist, hand and finger(s), initial encounter: Principal | ICD-10-CM

## 2014-05-06 DIAGNOSIS — S46909A Unspecified injury of unspecified muscle, fascia and tendon at shoulder and upper arm level, unspecified arm, initial encounter: Secondary | ICD-10-CM | POA: Insufficient documentation

## 2014-05-06 DIAGNOSIS — S4980XA Other specified injuries of shoulder and upper arm, unspecified arm, initial encounter: Secondary | ICD-10-CM | POA: Insufficient documentation

## 2014-05-06 DIAGNOSIS — Z87891 Personal history of nicotine dependence: Secondary | ICD-10-CM | POA: Insufficient documentation

## 2014-05-06 MED ORDER — PREDNISONE 10 MG PO TABS: ORAL_TABLET | ORAL | Status: DC

## 2014-05-06 MED ORDER — CYCLOBENZAPRINE HCL 10 MG PO TABS: 10.0000 mg | ORAL_TABLET | Freq: Two times a day (BID) | ORAL | Status: DC | PRN

## 2014-05-06 MED ORDER — TRAMADOL HCL 50 MG PO TABS: 50.0000 mg | ORAL_TABLET | Freq: Four times a day (QID) | ORAL | Status: DC | PRN

## 2014-05-06 NOTE — ED Provider Notes (Signed)
CSN: [id number]     Arrival date & time 05/06/14  1038 History  This chart was scribed for non-physician practitioner Terri Piedraourtney Forcucci, PA-C working with Candyce ChurnJohn David Wofford III, * by Leone PayorSonum Patel, ED Scribe. This patient was seen in room TR08C/TR08C and the patient's care was started at 10:48 AM.    Chief Complaint  Patient presents with  . Arm Pain    The history is provided by the patient. No language interpreter was used.  HPI Comments: Molly Beard is a 28 y.o. female who presents to the Emergency Department complaining of a right upper extremity injury that occurred yesterday. Patient states she was carrying a heavy object up stairs with the right arm when the injury occurred. Patient states the pain initially began in the right wrist and forearm but she states it progressed to the upper arm and shoulder. She reports transient numbness and tingling to the fingers of right hand after the injury occurred. She is able to move and straighten the affected arm but states it is painful. She states the pain is aggravated by certain movements and driving. She rates her pain as 8/10 currently. She denies similar symptoms in the past. She has taken ibuprofen, applied a heating pad, and tried warm showers with mild relief. She denies fever, chills, nausea, vomiting, arm swelling, warmth, redness, neck pain, back pain.    History reviewed. No pertinent past medical history. Past Surgical History  Procedure Laterality Date  . Tubal ligation    . Tubal ligation     History reviewed. No pertinent family history. History  Substance Use Topics  . Smoking status: Former Games developermoker  . Smokeless tobacco: Never Used  . Alcohol Use: No   OB History   Grav Para Term Preterm Abortions TAB SAB Ect Mult Living                 Review of Systems  See HPI  Allergies  Review of patient's allergies indicates no known allergies.  Home Medications   Prior to Admission medications   Medication Sig Start Date  End Date Taking? Authorizing Provider  cyclobenzaprine (FLEXERIL) 10 MG tablet Take 1 tablet (10 mg total) by mouth 2 (two) times daily as needed for muscle spasms. 05/06/14   Ezme Duch A Forcucci, PA-C  doxycycline (VIBRAMYCIN) 100 MG capsule Take 1 capsule (100 mg total) by mouth 2 (two) times daily. 12/06/13   Shari A Upstill, PA-C  HYDROcodone-acetaminophen (NORCO) 5-325 MG per tablet Take 1 tablet by mouth every 4 (four) hours as needed for moderate pain. 12/06/13   Shari A Upstill, PA-C  ibuprofen (ADVIL,MOTRIN) 200 MG tablet Take 800 mg by mouth every 4 (four) hours as needed for moderate pain.     Historical Provider, MD  predniSONE (DELTASONE) 10 MG tablet Day 1 take 6 pills Day 2 take 5 pills Day 3 take 4 pills Day 4 take 3 pills Day 5 take 2 pills Day 6 take 1 pill 05/06/14   Raedyn Wenke A Forcucci, PA-C  ranitidine (ZANTAC) 150 MG tablet Take 300 mg by mouth 2 (two) times daily as needed for heartburn.    Historical Provider, MD  traMADol (ULTRAM) 50 MG tablet Take 1 tablet (50 mg total) by mouth every 6 (six) hours as needed. 05/06/14   Fidelia Cathers A Forcucci, PA-C   BP 114/62  Pulse 74  Temp(Src) 97.7 F (36.5 C) (Oral)  Resp 18  SpO2 100% Physical Exam  Nursing note and vitals reviewed. Constitutional: She is oriented  to person, place, and time. She appears well-developed and well-nourished.  HENT:  Head: Normocephalic and atraumatic.  Mouth/Throat: No oropharyngeal exudate.  Eyes: Conjunctivae are normal. No scleral icterus.  Neck: Normal range of motion. Neck supple. No JVD present. No thyromegaly present.  Cardiovascular: Normal rate, regular rhythm, normal heart sounds and intact distal pulses.  Exam reveals no gallop and no friction rub.   No murmur heard. Pulmonary/Chest: Effort normal and breath sounds normal. No respiratory distress. She has no wheezes. She has no rales. She exhibits no tenderness.  Abdominal: She exhibits no distension.  Musculoskeletal:       Right  shoulder: She exhibits tenderness. She exhibits normal range of motion, no bony tenderness, no swelling, no effusion, no crepitus, no deformity, no laceration, no pain, no spasm, normal pulse and normal strength.       Right elbow: She exhibits normal range of motion, no swelling, no effusion, no deformity and no laceration. No tenderness found.       Right hand: She exhibits normal range of motion, no tenderness, no bony tenderness, normal two-point discrimination, normal capillary refill, no deformity, no laceration and no swelling. Decreased sensation noted. Normal strength noted.  Patient rises slowly from sitting to standing.  They walk without an antalgic gait.  There is no evidence of erythema, ecchymosis, or gross deformity.  There is tenderness to palpation over right trapezius.  Active ROM of the cervical spine full.  Sensation to light touch mildly diminished over radial, ulnar, and medial nerve distribution.   Positive sterling signs to the right.  Lymphadenopathy:    She has no cervical adenopathy.  Neurological: She is alert and oriented to person, place, and time.  Skin: Skin is warm and dry.  Psychiatric: She has a normal mood and affect. Her behavior is normal. Judgment and thought content normal.    ED Course  Procedures (including critical care time)  DIAGNOSTIC STUDIES: Oxygen Saturation is 100% on RA, normal by my interpretation.    COORDINATION OF CARE: 10:56 AM Will prescribe prednisone, flexeril, ultram. Discussed treatment plan with pt at bedside and pt agreed to plan. Return precautions given.    Labs Review Labs Reviewed - No data to display  Imaging Review No results found.   EKG Interpretation None      MDM   Final diagnoses:  Cervical radiculopathy  Right elbow pain   Patient is a 28 y.o. Female who presents to the ED with right arm pain with some tingling and numbness.  Physical exam reveals positive sterling signs and mildly diminished sensation  with good strength.  There is no loss of bowel or bladder or saddle anesthesia.  No other red flags found on examination or history.  Patient will be given prednisone taper, ultram for pain, and flexeril to take as needed for muscle spasm.  Patient was told to return to the ED for cauda equina symptoms.  Patient was given resource list and referral to the community health and wellness center.  Patient states understanding and agreement to the above plan.      I personally performed the services described in this documentation, which was scribed in my presence. The recorded information has been reviewed and is accurate.   Eben Burow, PA-C 05/06/14 1116

## 2014-05-06 NOTE — ED Notes (Signed)
Pt reports she was carring a heavy vacuemm cleaner and her RT arm started hurting. Pt reports Arm pain is worse when the arm is moved.

## 2014-05-06 NOTE — Discharge Instructions (Signed)
Cervical Radiculopathy °Cervical radiculopathy happens when a nerve in the neck is pinched or bruised by a slipped (herniated) disk or by arthritic changes in the bones of the cervical spine. This can occur due to an injury or as part of the normal aging process. Pressure on the cervical nerves can cause pain or numbness that runs from your neck all the way down into your arm and fingers. °CAUSES  °There are many possible causes, including: °· Injury. °· Muscle tightness in the neck from overuse. °· Swollen, painful joints (arthritis). °· Breakdown or degeneration in the bones and joints of the spine (spondylosis) due to aging. °· Bone spurs that may develop near the cervical nerves. °SYMPTOMS  °Symptoms include pain, weakness, or numbness in the affected arm and hand. Pain can be severe or irritating. Symptoms may be worse when extending or turning the neck. °DIAGNOSIS  °Your caregiver will ask about your symptoms and do a physical exam. He or she may test your strength and reflexes. X-rays, CT scans, and MRI scans may be needed in cases of injury or if the symptoms do not go away after a period of time. Electromyography (EMG) or nerve conduction testing may be done to study how your nerves and muscles are working. °TREATMENT  °Your caregiver may recommend certain exercises to help relieve your symptoms. Cervical radiculopathy can, and often does, get better with time and treatment. If your problems continue, treatment options may include: °· Wearing a soft collar for short periods of time. °· Physical therapy to strengthen the neck muscles. °· Medicines, such as nonsteroidal anti-inflammatory drugs (NSAIDs), oral corticosteroids, or spinal injections. °· Surgery. Different types of surgery may be done depending on the cause of your problems. °HOME CARE INSTRUCTIONS  °· Put ice on the affected area. °¨ Put ice in a plastic bag. °¨ Place a towel between your skin and the bag. °¨ Leave the ice on for 15-20 minutes,  03-04 times a day or as directed by your caregiver. °· If ice does not help, you can try using heat. Take a warm shower or bath, or use a hot water bottle as directed by your caregiver. °· You may try a gentle neck and shoulder massage. °· Use a flat pillow when you sleep. °· Only take over-the-counter or prescription medicines for pain, discomfort, or fever as directed by your caregiver. °· If physical therapy was prescribed, follow your caregiver's directions. °· If a soft collar was prescribed, use it as directed. °SEEK IMMEDIATE MEDICAL CARE IF:  °· Your pain gets much worse and cannot be controlled with medicines. °· You have weakness or numbness in your hand, arm, face, or leg. °· You have a high fever or a stiff, rigid neck. °· You lose bowel or bladder control (incontinence). °· You have trouble with walking, balance, or speaking. °MAKE SURE YOU:  °· Understand these instructions. °· Will watch your condition. °· Will get help right away if you are not doing well or get worse. °Document Released: 05/27/2001 Document Revised: 11/24/2011 Document Reviewed: 04/15/2011 °ExitCare® Patient Information ©2015 [employer]. This information is not intended to replace advice given to you by your health care provider. Make sure you discuss any questions you have with your health care provider. ° ° ° °Emergency Department Resource Guide °1) Find a Doctor and Pay Out of Pocket °Although you won't have to find out who is covered by your insurance plan, it is a good idea to ask around and get recommendations. You   will then need to call the office and see if the doctor you have chosen will accept you as a new patient and what types of options they offer for patients who are self-pay. Some doctors offer discounts or will set up payment plans for their patients who do not have insurance, but you will need to ask so you aren't surprised when you get to your appointment. ° °2) Contact Your Local Health Department °Not all  health departments have doctors that can see patients for sick visits, but many do, so it is worth a call to see if yours does. If you don't know where your local health department is, you can check in your phone book. The CDC also has a tool to help you locate your state's health department, and many state websites also have listings of all of their local health departments. ° °3) Find a Walk-in Clinic °If your illness is not likely to be very severe or complicated, you may want to try a walk in clinic. These are popping up all over the country in pharmacies, drugstores, and shopping centers. They're usually staffed by nurse practitioners or physician assistants that have been trained to treat common illnesses and complaints. They're usually fairly quick and inexpensive. However, if you have serious medical issues or chronic medical problems, these are probably not your best option. ° °No Primary Care Doctor: °- Call [facility] at  832-8000 - they can help you locate a primary care doctor that  accepts your insurance, provides certain services, etc. °- Physician Referral Service- 1-800-533-3463 ° °Chronic Pain Problems: °Organization         Address  Phone   Notes  °[facility]  (336) 297-2271 Patients need to be referred by their primary care doctor.  ° °Medication Assistance: °Organization         Address  Phone   Notes  °[place] [facility] [address] °[place], [place] [address] (336) 641-8030 --Must be a resident of [place] °-- Must have NO insurance coverage whatsoever (no Medicaid/ Medicare, etc.) °-- The pt. MUST have a primary care doctor that directs their care regularly and follows them in the community °  °[facility]  (866) 331-1348   °[employer]  (888) 892-1162   ° °Agencies that provide inexpensive medical care: °Organization         Address  Phone   Notes  °[facility]  (336) 832-8035   °[facility]     (336) 832-7272   °[facility] [address] °[place], [place] [address] (336) 832-4777   °[facility] [address], °[place] (336) 271-4999   °[facility]    (336) 373-0678   °[facility]    (336) 272-1050   °[facility] ° [address], [place] Phone:  (336) 832-4444, Fax:  (336) 832-4440 Hours of Operation:  9 am - 6 pm, M-F.  Also accepts Medicaid/Medicare and self-pay.  °[facility] ° [address], [place] Phone: (336) 832-3150, Fax: (336) [phone]. Hours of Operation:  8:30 am - 5:30 pm, M-F.  Also accepts Medicaid and self-pay.  °[facility] [place] [address], [place] Phone: (336) 878-6027   °[facility] [address], [place], [place] (336)723-1848, Ext. 123 Mondays & Thursdays: 7-9 AM.  First 15 patients are seen on a first come, first   serve basis. °  ° °Medicaid-accepting [place] Providers: ° °Organization         Address  Phone   Notes  °[facility] [address], [place] (336) 641-2100 Also accepts self-pay patients.  °[facility] [address], [place] ° (336) 856-9996   °[facility] [address], [place] (336) 288-8857   °[facility] [address] [place] [address], [place] (336) 299-7000   °Veita Bland [address], [place]  ° (336) 373-1557 Only accepts [place] Access Medicaid patients after they have their name applied to their card.  ° °Self-Pay (no insurance) in [place]: ° °Organization         Address  Phone   Notes  °Sickle Cell Patients, [facility] [address], [place] (336) 832-1970   °[facility] [address], [place] (336) 832-4400   °[facility] ° [address], [place] (336) 992-4800     °[facility]/Dr. Osei-Bonsu ° [address] [place] [address], [place] or [address], [place] (336) [phone] Phone number for both [place] and [place] locations is the same.  °[facility] [address], [place] (336) 299-0000   °[facility] [address] [place] [address], [place] or [address] (336) 852-7530 °(336) 878-2260   °[facility] [address], [place] (336) 350-1642, phone; (336) 294-5005, fax Sees patients 1st and 3rd Saturday of every month.  Must not qualify for public or private insurance (i.e. Medicaid, Medicare, [place] Health Choice, Veterans' Benefits) • Household income should be no more than 200% of the poverty level •The clinic cannot treat you if you are pregnant or think you are pregnant • Sexually transmitted diseases are not treated at the clinic.  ° ° °Dental Care: °Organization         Address  Phone  Notes  °[facility] [address], [place] (336) 641-6152 Accepts children up to age 21 who are enrolled in Medicaid or [place] Health Choice; pregnant women with a Medicaid card; and children who have applied for Medicaid or [place] Health Choice, but were declined, whose parents can pay a reduced fee at time of service.  °[facility] [place]  [address], [place] (336) 641-7733 Accepts children up to age 21 who are enrolled in Medicaid or [place] Health Choice; pregnant women with a Medicaid card; and children who have applied for Medicaid or [place] Health Choice, but were declined, whose parents can pay a reduced fee at time of service.  °[facility] ° [address],  (336) 641-4533 Patients are seen by appointment only. Walk-ins are not accepted. [facility] will see patients 18 years of age and older. °Monday - Tuesday (8am-5pm) °Most Wednesdays (8:30-5pm) °$30 per visit,  cash only  °[facility] ° [address], [place] (336) 641-4533 Patients are seen by appointment only. Walk-ins are not accepted. [facility] will see patients 18 years of age and older. °One Wednesday Evening (Monthly: Volunteer Based).  $30 per visit, cash only  °[facility]  (919) 537-3737 for adults; Children under age 4, call Graduate Pediatric Dentistry at (919) [phone]. Children aged 4-14, please   call (919) [phone] to request a pediatric application. ° Dental services are provided in all areas of dental care including fillings, crowns and bridges, complete and partial dentures, implants, gum treatment, root canals, and extractions. Preventive care is also provided. Treatment is provided to both adults and children. °Patients are selected via a lottery and there is often a waiting list. °  °[facility] [address], °[place] ° (336) 763-8833 [email or web address] °  °[facility] [address], [place], [place] (336)723-1848, Ext. 123 Second and Fourth Thursday of each month, opens at 6:30 AM; Clinic ends at 9 AM.  Patients are seen on a first-come first-served basis, and a limited number are seen during each clinic.  ° °[facility] ° [address], [place], [place] (336) 723-7904   Eligibility Requirements °You must have lived in [place], [place], or [place] counties for at least the last three months. °  You cannot be eligible for state or federal sponsored healthcare insurance, including Veterans Administration, Medicaid, or Medicare. °  You generally cannot be eligible for healthcare insurance through your employer.  °  How to apply: °Eligibility screenings are held every Tuesday and Wednesday afternoon from 1:00 pm until 4:00 pm. You do not need an appointment for the interview!  °[facility] [address], [place], [place] 336-631-2330   °[facility]   336-342-8273   °[facility]  336-703-3100   °[facility]  336-570-6415   ° °Behavioral Health Resources in the Community: °Intensive Outpatient Programs °Organization         Address  Phone  Notes  °[facility] [address], [place], [place] 336-878-6098   °[facility] [address], [place], [place] 336-832-9800   °[facility] [address], [place], [place] ° 336-882-2125   °[facility] [address],  °[place], [place] 1-800-853-5163 or 336-641-4981   °Substance Abuse Resources °Organization         Address  Phone  Notes  °[facility]  336-882-2125   °[facility]  336-784-9470   °[facility]  336-285-9073   °[facility]  336-845-3988   °Residential & Outpatient Substance Abuse Program  1-800-659-3381   °Psychological Services °Organization         Address  Phone  Notes  °[facility]  336- 832-9600   °[facility]  336- 378-7881   °[facility] [address], [place] 1-800-853-5163 or 336-641-4981   ° °Mobile Crisis Teams °Organization         Address  Phone  Notes  °[facility], Mobile Crisis Care Unit  1-877-626-1772   °Assertive °[facility] ° [address] [place], [place] 336-834-9664   °Sharon DeEsch [address] °[place] 336-554-5454   ° °Self-Help/Support Groups °Organization         Address  Phone             Notes  °[facility] [place] - variety of support groups  336- 373-1402 Call for more information  °Narcotics Anonymous (NA), [facility] [address], °[place]  2 meetings at this location  ° °Residential Treatment Programs °Organization         Address  Phone  Notes  °[facility] [address],    ° [place]  1-866-801-8205   °[facility] °   [address], [place], [place] 704-293-8524    °[facility] [address], [place] 336-845-3988 Admissions: 8am-3pm M-F  °[facility] [address]    °[place], [place] 336-841-1104   °[facility] [address], [place], [place] 336-379-7146   °[facility] [address]  °[place], [place] 336-285-9073   °[facility] - Intensive Outpatient [address], [place], [place] 336-852-3033   °[facility] [address]  °[place], [place] 1-877-615-2722 or 336-784-9470   °[facility] [address] [place], [place] 336-227-7417 Accepts Medicaid  °[facility] [address]  °[place] 1-800-659-3381 Substance Abuse/Addiction Treatment  ° °[place] Behavioral Health Resources °Organization         Address  Phone  Notes  °[facility]  (888) 581-9988   °Julie Brannon, PhD [address] [place], [place]   (336) 349-5553 or (336) 951-0000   °[facility]   [address] °[place], [place] (336) 349-4454   °[facility] [address], [place], [place] (336) 342-8316 Insurance/Medicaid/sponsorship through [facility]  °[facility] [address]                                    [place], [place] (336) 342-8316 Therapy/tele-psych/case  °[facility] [address]  ° [place], [place] (336) 349-2233    °Dr. Arfeen  (336) 349-4544   °[facility] [place]  [employer] [place] [facility] 1) [address],  °2) [address], [place] °3)  [address], [place] (336) 349-3220 °(336) 342-7768 ° °(336) 342-8140   °[place] [facility] (336) 342-1394 or (336) 342-3537 (After Hours)    ° ° ° °

## 2014-05-10 NOTE — ED Provider Notes (Signed)
Medical screening examination/treatment/procedure(s) were performed by non-physician practitioner and as supervising physician I was immediately available for consultation/collaboration.   EKG Interpretation None        Candyce Churn III, MD 05/10/14 [phone]

## 2015-07-17 ENCOUNTER — Encounter (HOSPITAL_COMMUNITY): Payer: Self-pay | Admitting: *Deleted

## 2015-07-17 ENCOUNTER — Inpatient Hospital Stay (HOSPITAL_COMMUNITY)
Admission: EM | Admit: 2015-07-17 | Discharge: 2015-07-20 | DRG: 419 | Disposition: A | Discharge status: Home / Self Care | Payer: Self-pay | Attending: General Surgery | Admitting: General Surgery

## 2015-07-17 ENCOUNTER — Emergency Department (HOSPITAL_COMMUNITY): Payer: Self-pay

## 2015-07-17 DIAGNOSIS — K851 Biliary acute pancreatitis without necrosis or infection: Principal | ICD-10-CM | POA: Diagnosis present

## 2015-07-17 DIAGNOSIS — K819 Cholecystitis, unspecified: Secondary | ICD-10-CM

## 2015-07-17 DIAGNOSIS — Z87891 Personal history of nicotine dependence: Secondary | ICD-10-CM

## 2015-07-17 DIAGNOSIS — R1011 Right upper quadrant pain: Secondary | ICD-10-CM

## 2015-07-17 HISTORY — DX: Gastro-esophageal reflux disease without esophagitis: K21.9

## 2015-07-17 LAB — URINALYSIS, ROUTINE W REFLEX MICROSCOPIC
Bilirubin Urine: NEGATIVE
GLUCOSE, UA: NEGATIVE mg/dL
KETONES UR: NEGATIVE mg/dL
NITRITE: NEGATIVE
Protein, ur: NEGATIVE mg/dL
Specific Gravity, Urine: 1.015 (ref 1.005–1.030)
Urobilinogen, UA: 1 mg/dL (ref 0.0–1.0)
pH: 6.5 (ref 5.0–8.0)

## 2015-07-17 LAB — COMPREHENSIVE METABOLIC PANEL
ALT: 12 U/L — ABNORMAL LOW (ref 14–54)
ANION GAP: 6 (ref 5–15)
AST: 18 U/L (ref 15–41)
Albumin: 3.9 g/dL (ref 3.5–5.0)
Alkaline Phosphatase: 59 U/L (ref 38–126)
BUN: <5 mg/dL — ABNORMAL LOW (ref 6–20)
CHLORIDE: 103 mmol/L (ref 101–111)
CO2: 26 mmol/L (ref 22–32)
Calcium: 9.1 mg/dL (ref 8.9–10.3)
Creatinine, Ser: 0.71 mg/dL (ref 0.44–1.00)
GFR calc Af Amer: >60 mL/min (ref >60)
Glucose, Bld: 97 mg/dL (ref 65–99)
Potassium: 3.7 mmol/L (ref 3.5–5.1)
SODIUM: 135 mmol/L (ref 135–145)
Total Bilirubin: 0.8 mg/dL (ref 0.3–1.2)
Total Protein: 7 g/dL (ref 6.5–8.1)

## 2015-07-17 LAB — CBC
HCT: 39.8 % (ref 36.0–46.0)
HEMOGLOBIN: 13.5 g/dL (ref 12.0–15.0)
MCH: 30.1 pg (ref 26.0–34.0)
MCHC: 33.9 g/dL (ref 30.0–36.0)
MCV: 88.8 fL (ref 78.0–100.0)
Platelets: 303 10*3/uL (ref 150–400)
RBC: 4.48 MIL/uL (ref 3.87–5.11)
RDW: 12.6 % (ref 11.5–15.5)
WBC: 6 10*3/uL (ref 4.0–10.5)

## 2015-07-17 LAB — POC URINE PREG, ED: PREG TEST UR: NEGATIVE

## 2015-07-17 LAB — LIPASE, BLOOD: LIPASE: 303 U/L — AB (ref 11–51)

## 2015-07-17 LAB — URINE MICROSCOPIC-ADD ON

## 2015-07-17 MED ORDER — POTASSIUM CHLORIDE IN NACL 20-0.9 MEQ/L-% IV SOLN
INTRAVENOUS | Status: DC
  Administered 2015-07-17 – 2015-07-20 (×5): via INTRAVENOUS
  Filled 2015-07-17 (×7): qty 1000

## 2015-07-17 MED ORDER — HYDROMORPHONE HCL 1 MG/ML IJ SOLN
0.5000 mg | INTRAMUSCULAR | Status: DC | PRN
Start: 2015-07-17 — End: 2015-07-20
  Administered 2015-07-17 – 2015-07-19 (×3): 1 mg via INTRAVENOUS
  Filled 2015-07-17 (×4): qty 1

## 2015-07-17 MED ORDER — ONDANSETRON HCL 4 MG/2ML IJ SOLN
4.0000 mg | Freq: Once | INTRAMUSCULAR | Status: AC
  Administered 2015-07-17: 4 mg via INTRAVENOUS
  Filled 2015-07-17: qty 2

## 2015-07-17 MED ORDER — HYDROMORPHONE HCL 1 MG/ML IJ SOLN
0.5000 mg | Freq: Once | INTRAMUSCULAR | Status: AC
  Administered 2015-07-17: 0.5 mg via INTRAVENOUS
  Filled 2015-07-17: qty 1

## 2015-07-17 MED ORDER — KETOROLAC TROMETHAMINE 30 MG/ML IJ SOLN
30.0000 mg | Freq: Once | INTRAMUSCULAR | Status: AC
  Administered 2015-07-17: 30 mg via INTRAVENOUS
  Filled 2015-07-17: qty 1

## 2015-07-17 MED ORDER — MORPHINE SULFATE (PF) 4 MG/ML IV SOLN
6.0000 mg | Freq: Once | INTRAVENOUS | Status: AC
  Administered 2015-07-17: 6 mg via INTRAVENOUS
  Filled 2015-07-17: qty 2

## 2015-07-17 MED ORDER — MORPHINE SULFATE (PF) 4 MG/ML IV SOLN: 4.0000 mg | INTRAVENOUS | Status: DC | PRN

## 2015-07-17 MED ORDER — ONDANSETRON HCL 4 MG/2ML IJ SOLN
4.0000 mg | Freq: Four times a day (QID) | INTRAMUSCULAR | Status: DC | PRN
  Administered 2015-07-19: 4 mg via INTRAVENOUS
  Filled 2015-07-17: qty 2

## 2015-07-17 MED ORDER — ENOXAPARIN SODIUM 40 MG/0.4ML ~~LOC~~ SOLN
40.0000 mg | SUBCUTANEOUS | Status: DC
  Administered 2015-07-17 – 2015-07-18 (×2): 40 mg via SUBCUTANEOUS
  Filled 2015-07-17 (×2): qty 0.4

## 2015-07-17 MED ORDER — ONDANSETRON 4 MG PO TBDP
4.0000 mg | ORAL_TABLET | Freq: Four times a day (QID) | ORAL | Status: DC | PRN
  Administered 2015-07-17: 4 mg via ORAL
  Filled 2015-07-17: qty 1

## 2015-07-17 MED ORDER — HYDROMORPHONE HCL 1 MG/ML IJ SOLN: 1.0000 mg | Freq: Once | INTRAMUSCULAR | Status: DC

## 2015-07-17 MED ORDER — SODIUM CHLORIDE 0.9 % IV BOLUS (SEPSIS)
1000.0000 mL | Freq: Once | INTRAVENOUS | Status: AC
  Administered 2015-07-17: 1000 mL via INTRAVENOUS

## 2015-07-17 MED ORDER — MORPHINE SULFATE (PF) 4 MG/ML IV SOLN
4.0000 mg | Freq: Once | INTRAVENOUS | Status: AC
  Administered 2015-07-17: 4 mg via INTRAVENOUS
  Filled 2015-07-17: qty 1

## 2015-07-17 NOTE — ED Notes (Signed)
Pt started having reflux symptoms and took no meds helped.  No with right sided sharp shooting pain.  Right lower back pain. PT reports urinary pain.

## 2015-07-17 NOTE — ED Notes (Signed)
Pt still in [place]

## 2015-07-17 NOTE — H&P (Signed)
Molly Beard is an 29 y.o. female.   Chief Complaint: Abdominal pain, nausea, vomiting HPI: This is a 29 year old female who presents with a 24-hour history of sharp epigastric abdominal pain with nausea and vomiting. The pain does not radiate anywhere else. She has had no previous history of similar pain although she has had what she describes as heartburn for years. She denies jaundice. She denies hematemesis. Bowel movements are normal. She is otherwise without complaints.  History reviewed. No pertinent past medical history.  Past Surgical History  Procedure Laterality Date  . Tubal ligation    . Tubal ligation      No family history on file. Social History:  reports that she has quit smoking. She has never used smokeless tobacco. She reports that she does not drink alcohol or use illicit drugs.  Allergies: No Known Allergies  Medications Prior to Admission  Medication Sig Dispense Refill  . acetaminophen (TYLENOL) 325 MG tablet Take 650 mg by mouth every 6 (six) hours as needed for mild pain or moderate pain.    Marland Kitchen ibuprofen (ADVIL,MOTRIN) 200 MG tablet Take 800 mg by mouth every 4 (four) hours as needed for moderate pain.     . ranitidine (ZANTAC) 150 MG tablet Take 300 mg by mouth 2 (two) times daily as needed for heartburn.    . cyclobenzaprine (FLEXERIL) 10 MG tablet Take 1 tablet (10 mg total) by mouth 2 (two) times daily as needed for muscle spasms. (Patient not taking: Reported on 07/17/2015) 20 tablet 0  . doxycycline (VIBRAMYCIN) 100 MG capsule Take 1 capsule (100 mg total) by mouth 2 (two) times daily. (Patient not taking: Reported on 07/17/2015) 14 capsule 0  . HYDROcodone-acetaminophen (NORCO) 5-325 MG per tablet Take 1 tablet by mouth every 4 (four) hours as needed for moderate pain. (Patient not taking: Reported on 07/17/2015) 8 tablet 0  . predniSONE (DELTASONE) 10 MG tablet Day 1 take 6 pills Day 2 take 5 pills Day 3 take 4 pills Day 4 take 3 pills Day 5 take 2  pills Day 6 take 1 pill (Patient not taking: Reported on 07/17/2015) 21 tablet 0  . traMADol (ULTRAM) 50 MG tablet Take 1 tablet (50 mg total) by mouth every 6 (six) hours as needed. (Patient not taking: Reported on 07/17/2015) 15 tablet 0    Results for orders placed or performed during the hospital encounter of 07/17/15 (from the past 48 hour(s))  Lipase, blood     Status: Abnormal   Collection Time: 07/17/15  3:08 PM  Result Value Ref Range   Lipase 303 (H) 11 - 51 U/L    Comment: Please note change in reference range.  Comprehensive metabolic panel     Status: Abnormal   Collection Time: 07/17/15  3:08 PM  Result Value Ref Range   Sodium 135 135 - 145 mmol/L   Potassium 3.7 3.5 - 5.1 mmol/L   Chloride 103 101 - 111 mmol/L   CO2 26 22 - 32 mmol/L   Glucose, Bld 97 65 - 99 mg/dL   BUN <5 (L) 6 - 20 mg/dL   Creatinine, Ser 0.71 0.44 - 1.00 mg/dL   Calcium 9.1 8.9 - 10.3 mg/dL   Total Protein 7.0 6.5 - 8.1 g/dL   Albumin 3.9 3.5 - 5.0 g/dL   AST 18 15 - 41 U/L   ALT 12 (L) 14 - 54 U/L   Alkaline Phosphatase 59 38 - 126 U/L   Total Bilirubin 0.8 0.3 - 1.2  mg/dL   GFR calc non Af Amer >60 >60 mL/min   GFR calc Af Amer >60 >60 mL/min    Comment: (NOTE) The eGFR has been calculated using the CKD EPI equation. This calculation has not been validated in all clinical situations. eGFR's persistently <60 mL/min signify possible Chronic Kidney Disease.    Anion gap 6 5 - 15  CBC     Status: None   Collection Time: 07/17/15  3:08 PM  Result Value Ref Range   WBC 6.0 4.0 - 10.5 K/uL   RBC 4.48 3.87 - 5.11 MIL/uL   Hemoglobin 13.5 12.0 - 15.0 g/dL   HCT 39.8 36.0 - 46.0 %   MCV 88.8 78.0 - 100.0 fL   MCH 30.1 26.0 - 34.0 pg   MCHC 33.9 30.0 - 36.0 g/dL   RDW 12.6 11.5 - 15.5 %   Platelets 303 150 - 400 K/uL  Urinalysis, Routine w reflex microscopic (not at [facility])     Status: Abnormal   Collection Time: 07/17/15  4:51 PM  Result Value Ref Range   Color, Urine YELLOW YELLOW    APPearance CLEAR CLEAR   Specific Gravity, Urine 1.015 1.005 - 1.030   pH 6.5 5.0 - 8.0   Glucose, UA NEGATIVE NEGATIVE mg/dL   Hgb urine dipstick SMALL (A) NEGATIVE   Bilirubin Urine NEGATIVE NEGATIVE   Ketones, ur NEGATIVE NEGATIVE mg/dL   Protein, ur NEGATIVE NEGATIVE mg/dL   Urobilinogen, UA 1.0 0.0 - 1.0 mg/dL   Nitrite NEGATIVE NEGATIVE   Leukocytes, UA TRACE (A) NEGATIVE  Urine microscopic-add on     Status: None   Collection Time: 07/17/15  4:51 PM  Result Value Ref Range   Squamous Epithelial / LPF RARE RARE   WBC, UA 0-2 <3 WBC/hpf   RBC / HPF 3-6 <3 RBC/hpf   Urine-Other MUCOUS PRESENT   POC urine preg, ED (not at [facility])     Status: None   Collection Time: 07/17/15  4:55 PM  Result Value Ref Range   Preg Test, Ur NEGATIVE NEGATIVE    Comment:        THE SENSITIVITY OF THIS METHODOLOGY IS >24 mIU/mL    US Abdomen Complete  07/17/2015  CLINICAL DATA:  Acute right upper quadrant abdominal pain and elevated lipase. EXAM: ULTRASOUND ABDOMEN COMPLETE COMPARISON:  CT scan 11/25/2011 FINDINGS: Gallbladder: Numerous mobile gallstones and echogenic sludge with acoustic shadowing. No gallbladder wall thickening. There may be a small amount of pericholecystic fluid. Negative sonographic Murphy sign. Common bile duct: Diameter: 7-8 mm Liver: Normal echogenicity without focal lesion or biliary dilatation. IVC: Normal caliber Pancreas: Sonographically unremarkable Spleen: Normal size and echogenicity without focal lesions Right Kidney: Length: 10.5 cm. Normal renal cortical thickness and echogenicity without focal lesions or hydronephrosis. Left Kidney: Length: 10.0 cm. Normal renal cortical thickness and echogenicity without focal lesions or hydronephrosis. Abdominal aorta: Normal caliber Other findings: None. IMPRESSION: 1. Cholelithiasis without definite findings for acute cholecystitis. 2. Common bile duct dilatation. Could not exclude common bile duct stone. ERCP or MRCP may be helpful for  further evaluation. 3. Normal sonographic appearance of the liver, spleen, pancreas and both kidneys. Electronically Signed   By: Marijo Sanes M.D.   On: 07/17/2015 18:40    Review of Systems  All other systems reviewed and are negative.   Blood pressure 102/58, pulse 74, temperature 98.8 F (37.1 C), temperature source Oral, resp. rate 20, SpO2 100 %. Physical Exam  Constitutional: She is oriented to person,  place, and time. She appears well-developed and well-nourished.  Uncomfortable in appearance  HENT:  Head: Normocephalic and atraumatic.  Right Ear: External ear normal.  Left Ear: External ear normal.  Nose: Nose normal.  Mouth/Throat: Oropharynx is clear and moist. No oropharyngeal exudate.  Eyes: Conjunctivae are normal. Pupils are equal, round, and reactive to light. No scleral icterus.  Neck: Normal range of motion. No tracheal deviation present.  Cardiovascular: Normal rate, regular rhythm, normal heart sounds and intact distal pulses.   No murmur heard. Respiratory: Effort normal and breath sounds normal. No respiratory distress. She has no wheezes.  GI: Soft. She exhibits no distension. There is tenderness. There is guarding.  There is moderate tenderness and guarding in the epigastrium and right upper quadrant  Musculoskeletal: Normal range of motion. She exhibits no edema or tenderness.  Neurological: She is alert and oriented to person, place, and time.  Skin: Skin is warm and dry. She is not diaphoretic. No erythema.  Psychiatric: Her behavior is normal. Judgment normal.     Assessment/Plan Gallstone pancreatitis  I explained the diagnosis to the patient and her family. Admission to the hospital for IV rehydration, bowel rest, pain control, and eventual laparoscopic cholecystectomy with cholangiogram is recommended. We will repeat her laboratory data in the morning. I explained that we would proceed with surgery when her pancreatitis has  improved.  Mariaguadalupe Fialkowski A 07/17/2015, 9:34 PM

## 2015-07-17 NOTE — ED Notes (Signed)
MD at the bedside  

## 2015-07-17 NOTE — ED Provider Notes (Signed)
CSN: [id number]     Arrival date & time 07/17/15  1323 History   First MD Initiated Contact with Patient 07/17/15 1707     Chief Complaint  Patient presents with  . Abdominal Pain     (Consider location/radiation/quality/duration/timing/severity/associated sxs/prior Treatment) Patient is a 29 y.o. female presenting with abdominal pain. The history is provided by the patient. The history is limited by the condition of the patient.  Abdominal Pain Pain location:  RUQ Pain quality: sharp and shooting   Pain radiates to:  Back Pain severity:  Severe Onset quality:  Gradual Duration:  1 day Timing:  Constant Progression:  Waxing and waning Chronicity:  Recurrent Context: eating   Context: not alcohol use, not diet changes, not medication withdrawal, not previous surgeries, not recent illness, not sick contacts, not suspicious food intake and not trauma   Relieved by:  Nothing Worsened by:  Eating and palpation Ineffective treatments:  Lying down Associated symptoms: anorexia, dysuria, nausea and vomiting   Associated symptoms: no chest pain, no fever, no shortness of breath, no vaginal bleeding and no vaginal discharge   Risk factors: no alcohol abuse, has not had multiple surgeries, not obese, not pregnant and no recent hospitalization     Past Medical History  Diagnosis Date  . GERD (gastroesophageal reflux disease)    Past Surgical History  Procedure Laterality Date  . Tubal ligation  2010  . Tubal ligation     History reviewed. No pertinent family history. Social History  Substance Use Topics  . Smoking status: Current Every Day Smoker -- 0.12 packs/day    Types: Cigarettes  . Smokeless tobacco: Never Used  . Alcohol Use: No   OB History    No data available     Review of Systems  Constitutional: Negative for fever.  HENT: Negative for facial swelling.   Respiratory: Negative for shortness of breath.   Cardiovascular: Negative for chest pain.  Gastrointestinal:  Positive for nausea, vomiting, abdominal pain and anorexia.  Genitourinary: Positive for dysuria. Negative for decreased urine volume, vaginal bleeding, vaginal discharge, vaginal pain, menstrual problem and pelvic pain.  Musculoskeletal: Negative for back pain.  Skin: Negative for rash.  Neurological: Negative for headaches.  Psychiatric/Behavioral: Negative for confusion.      Allergies  Review of patient's allergies indicates no known allergies.  Home Medications   Prior to Admission medications   Medication Sig Start Date End Date Taking? Authorizing Provider  acetaminophen (TYLENOL) 325 MG tablet Take 650 mg by mouth every 6 (six) hours as needed for mild pain or moderate pain.   Yes Historical Provider, MD  ibuprofen (ADVIL,MOTRIN) 200 MG tablet Take 800 mg by mouth every 4 (four) hours as needed for moderate pain.    Yes Historical Provider, MD  ranitidine (ZANTAC) 150 MG tablet Take 300 mg by mouth 2 (two) times daily as needed for heartburn.   Yes Historical Provider, MD  cyclobenzaprine (FLEXERIL) 10 MG tablet Take 1 tablet (10 mg total) by mouth 2 (two) times daily as needed for muscle spasms. Patient not taking: Reported on 07/17/2015 05/06/14   Terri Piedraourtney Forcucci, PA-C  doxycycline (VIBRAMYCIN) 100 MG capsule Take 1 capsule (100 mg total) by mouth 2 (two) times daily. Patient not taking: Reported on 07/17/2015 12/06/13   Elpidio AnisShari Upstill, PA-C  HYDROcodone-acetaminophen (NORCO) 5-325 MG per tablet Take 1 tablet by mouth every 4 (four) hours as needed for moderate pain. Patient not taking: Reported on 07/17/2015 12/06/13   Elpidio AnisShari Upstill, PA-C  predniSONE (  DELTASONE) 10 MG tablet Day 1 take 6 pills Day 2 take 5 pills Day 3 take 4 pills Day 4 take 3 pills Day 5 take 2 pills Day 6 take 1 pill Patient not taking: Reported on 07/17/2015 05/06/14   Toni Amend Forcucci, PA-C  traMADol (ULTRAM) 50 MG tablet Take 1 tablet (50 mg total) by mouth every 6 (six) hours as needed. Patient not  taking: Reported on 07/17/2015 05/06/14   Courtney Forcucci, PA-C   BP 118/51 mmHg  Pulse 68  Temp(Src) 98.8 F (37.1 C) (Oral)  Resp 16  SpO2 100% Physical Exam  Constitutional: She is oriented to person, place, and time. She appears well-developed and well-nourished. No distress.  HENT:  Head: Normocephalic and atraumatic.  Right Ear: External ear normal.  Left Ear: External ear normal.  Nose: Nose normal.  Mouth/Throat: Oropharynx is clear and moist. No oropharyngeal exudate.  Eyes: Conjunctivae and EOM are normal. Pupils are equal, round, and reactive to light. Right eye exhibits no discharge. Left eye exhibits no discharge. No scleral icterus.  Neck: Normal range of motion. Neck supple. No JVD present. No tracheal deviation present. No thyromegaly present.  Cardiovascular: Normal rate, regular rhythm and intact distal pulses.   Pulmonary/Chest: Effort normal and breath sounds normal. No stridor. No respiratory distress. She has no wheezes. She has no rales. She exhibits no tenderness.  Abdominal: Soft. She exhibits no distension. There is tenderness in the right upper quadrant. There is positive Murphy's sign. There is no CVA tenderness.  Musculoskeletal: Normal range of motion. She exhibits no edema or tenderness.  Lymphadenopathy:    She has no cervical adenopathy.  Neurological: She is alert and oriented to person, place, and time.  Skin: Skin is warm and dry. No rash noted. She is not diaphoretic. No erythema. No pallor.  Psychiatric: She has a normal mood and affect. Her behavior is normal. Judgment and thought content normal.  Nursing note and vitals reviewed.   ED Course  Procedures (including critical care time) Labs Review Labs Reviewed  LIPASE, BLOOD - Abnormal; Notable for the following:    Lipase 303 (*)    All other components within normal limits  COMPREHENSIVE METABOLIC PANEL - Abnormal; Notable for the following:    BUN <5 (*)    ALT 12 (*)    All other  components within normal limits  URINALYSIS, ROUTINE W REFLEX MICROSCOPIC (NOT AT [facility]) - Abnormal; Notable for the following:    Hgb urine dipstick SMALL (*)    Leukocytes, UA TRACE (*)    All other components within normal limits  COMPREHENSIVE METABOLIC PANEL - Abnormal; Notable for the following:    Glucose, Bld 104 (*)    BUN <5 (*)    Calcium 8.5 (*)    Total Protein 5.3 (*)    Albumin 2.9 (*)    AST 417 (*)    ALT 247 (*)    Anion gap 4 (*)    All other components within normal limits  CBC - Abnormal; Notable for the following:    Hemoglobin 11.8 (*)    HCT 35.3 (*)    All other components within normal limits  LIPASE, BLOOD - Abnormal; Notable for the following:    Lipase 126 (*)    All other components within normal limits  COMPREHENSIVE METABOLIC PANEL - Abnormal; Notable for the following:    CO2 17 (*)    Glucose, Bld 48 (*)    Calcium 8.6 (*)    Total  Protein 5.7 (*)    Albumin 3.2 (*)    AST 77 (*)    ALT 142 (*)    All other components within normal limits  CBC - Abnormal; Notable for the following:    RBC 3.79 (*)    Hemoglobin 11.8 (*)    HCT 34.8 (*)    All other components within normal limits  CBC  URINE MICROSCOPIC-ADD ON  LIPASE, BLOOD  POC URINE PREG, ED    Imaging Review US Abdomen Complete  07/17/2015  CLINICAL DATA:  Acute right upper quadrant abdominal pain and elevated lipase. EXAM: ULTRASOUND ABDOMEN COMPLETE COMPARISON:  CT scan 11/25/2011 FINDINGS: Gallbladder: Numerous mobile gallstones and echogenic sludge with acoustic shadowing. No gallbladder wall thickening. There may be a small amount of pericholecystic fluid. Negative sonographic Murphy sign. Common bile duct: Diameter: 7-8 mm Liver: Normal echogenicity without focal lesion or biliary dilatation. IVC: Normal caliber Pancreas: Sonographically unremarkable Spleen: Normal size and echogenicity without focal lesions Right Kidney: Length: 10.5 cm. Normal renal cortical thickness and  echogenicity without focal lesions or hydronephrosis. Left Kidney: Length: 10.0 cm. Normal renal cortical thickness and echogenicity without focal lesions or hydronephrosis. Abdominal aorta: Normal caliber Other findings: None. IMPRESSION: 1. Cholelithiasis without definite findings for acute cholecystitis. 2. Common bile duct dilatation. Could not exclude common bile duct stone. ERCP or MRCP may be helpful for further evaluation. 3. Normal sonographic appearance of the liver, spleen, pancreas and both kidneys. Electronically Signed   By: Rudie Meyer M.D.   On: 07/17/2015 18:40   I have personally reviewed and evaluated these images and lab results as part of my medical decision-making.   EKG Interpretation None      MDM   Final diagnoses:  RUQ pain    Pt with RUQ pain and symptoms c/w cholecystitis.  Lipase elevated.  Concern for retained CBD stone vs. Recently passed CBD stone.  LFTs WNL.  ABD US shows cholelithiasis with CBD of 7-46mm.  Pt discussed with GI and they felt that CBD was WNL from their standpoint and likely no CBD stone without LFT abnormalities.  They recommended primary surgical evaluation with IOC and ERCP if necessary.  Pt given pain medication IVF and antiemetics.  Pt discussed with surgery and will be admitted for surgical mgt.  Patient care was discussed with my attending, Dr. Verdie Mosher.     Gavin Pound, MD 07/19/15 1059  Lavera Guise, MD 07/20/15 [phone]

## 2015-07-17 NOTE — ED Notes (Signed)
Patient returned from US.

## 2015-07-18 LAB — COMPREHENSIVE METABOLIC PANEL
ALT: 247 U/L — ABNORMAL HIGH (ref 14–54)
ANION GAP: 4 — AB (ref 5–15)
AST: 417 U/L — AB (ref 15–41)
Albumin: 2.9 g/dL — ABNORMAL LOW (ref 3.5–5.0)
Alkaline Phosphatase: 51 U/L (ref 38–126)
BUN: <5 mg/dL — ABNORMAL LOW (ref 6–20)
CHLORIDE: 108 mmol/L (ref 101–111)
CO2: 25 mmol/L (ref 22–32)
Calcium: 8.5 mg/dL — ABNORMAL LOW (ref 8.9–10.3)
Creatinine, Ser: 0.75 mg/dL (ref 0.44–1.00)
Glucose, Bld: 104 mg/dL — ABNORMAL HIGH (ref 65–99)
POTASSIUM: 4.2 mmol/L (ref 3.5–5.1)
Sodium: 137 mmol/L (ref 135–145)
Total Bilirubin: 1.1 mg/dL (ref 0.3–1.2)
Total Protein: 5.3 g/dL — ABNORMAL LOW (ref 6.5–8.1)

## 2015-07-18 LAB — CBC
HEMATOCRIT: 35.3 % — AB (ref 36.0–46.0)
HEMOGLOBIN: 11.8 g/dL — AB (ref 12.0–15.0)
MCH: 30.3 pg (ref 26.0–34.0)
MCHC: 33.4 g/dL (ref 30.0–36.0)
MCV: 90.7 fL (ref 78.0–100.0)
Platelets: 227 10*3/uL (ref 150–400)
RBC: 3.89 MIL/uL (ref 3.87–5.11)
RDW: 12.7 % (ref 11.5–15.5)
WBC: 5.2 10*3/uL (ref 4.0–10.5)

## 2015-07-18 LAB — LIPASE, BLOOD: Lipase: 126 U/L — ABNORMAL HIGH (ref 11–51)

## 2015-07-18 NOTE — ED Provider Notes (Signed)
I saw and evaluated the patient, reviewed the resident's note and I agree with the findings and plan.  29 year old female with history of tubal ligation and no other abdominal surgeries who presents with epigastric pain, N/V after eating. Recent history of possible post-prandial upper abdominal pain. No F/C. Vital signs stable and on exam, she has RUQ and epigastric tenderness to palpation. No peritoneal signs. Evidence of pancreatitis with lipase of 300. C/f gallstone disease and RUQ US performed, visualized, and showing evidence of cholelithiasis without cholecystitis and c/f choledocholithiasis without significant elevation of LFTs and alk phos. Discussed with general surgery, who will admit for IVF and pain control with eventual plans for cholecystectomy and intra-op cholangiogram once pancreatitis improve.    Forde Dandy, MD 07/18/15 1322

## 2015-07-18 NOTE — Progress Notes (Signed)
[facility] Progress Note     Subjective: Still in quite a bit of pain in her epigastrium.  Pain improved from when she came in.  Did have episode of nausea which she got antiemetics for, but then she had some ice chips and threw up about 30 min after that.  Urinating well, up OOB.  BM yesterday.   Objective: Vital signs in last 24 hours: Temp:  [98.4 F (36.9 C)-98.8 F (37.1 C)] 98.4 F (36.9 C) (11/02 0623) Pulse Rate:  [57-93] 57 (11/02 0623) Resp:  [15-20] 20 (11/02 0623) BP: (96-119)/(45-85) 96/49 mmHg (11/02 0623) SpO2:  [99 %-100 %] 100 % (11/02 0623) Weight:  [61.508 kg (135 lb 9.6 oz)] 61.508 kg (135 lb 9.6 oz) (11/02 0359) Last BM Date: 07/17/15  Intake/Output from previous day: 11/01 0701 - 11/02 0700 In: 960.4 [I.V.:960.4] Out: -  Intake/Output this shift:    PE: Gen:  Alert, NAD, pleasant Abd: Soft, mild distension, moderate tenderness in RUQ which radiates to her back and lateral upper abdomen, +BS, no HSM,  abdominal scars noted at umbilicus (tubal ligation)   Lab Results:   Recent Labs  07/17/15 1508 07/18/15 0354  WBC 6.0 5.2  HGB 13.5 11.8*  HCT 39.8 35.3*  PLT 303 227   BMET  Recent Labs  07/17/15 1508 07/18/15 0354  NA 135 137  K 3.7 4.2  CL 103 108  CO2 26 25  GLUCOSE 97 104*  BUN <5* <5*  CREATININE 0.71 0.75  CALCIUM 9.1 8.5*   PT/INR No results for input(s): LABPROT, INR in the last 72 hours. CMP     Component Value Date/Time   NA 137 07/18/2015 0354   K 4.2 07/18/2015 0354   CL 108 07/18/2015 0354   CO2 25 07/18/2015 0354   GLUCOSE 104* 07/18/2015 0354   BUN <5* 07/18/2015 0354   CREATININE 0.75 07/18/2015 0354   CALCIUM 8.5* 07/18/2015 0354   PROT 5.3* 07/18/2015 0354   ALBUMIN 2.9* 07/18/2015 0354   AST 417* 07/18/2015 0354   ALT 247* 07/18/2015 0354   ALKPHOS 51 07/18/2015 0354   BILITOT 1.1 07/18/2015 0354   GFRNONAA >60 07/18/2015 0354   GFRAA >60 07/18/2015 0354   Lipase     Component Value  Date/Time   LIPASE 126* 07/18/2015 0354       Studies/Results: [place] Abdomen Complete  07/17/2015  CLINICAL DATA:  Acute right upper quadrant abdominal pain and elevated lipase. EXAM: ULTRASOUND ABDOMEN COMPLETE COMPARISON:  CT scan 11/25/2011 FINDINGS: Gallbladder: Numerous mobile gallstones and echogenic sludge with acoustic shadowing. No gallbladder wall thickening. There may be a small amount of pericholecystic fluid. Negative sonographic Murphy sign. Common bile duct: Diameter: 7-8 mm Liver: Normal echogenicity without focal lesion or biliary dilatation. IVC: Normal caliber Pancreas: Sonographically unremarkable Spleen: Normal size and echogenicity without focal lesions Right Kidney: Length: 10.5 cm. Normal renal cortical thickness and echogenicity without focal lesions or hydronephrosis. Left Kidney: Length: 10.0 cm. Normal renal cortical thickness and echogenicity without focal lesions or hydronephrosis. Abdominal aorta: Normal caliber Other findings: None. IMPRESSION: 1. Cholelithiasis without definite findings for acute cholecystitis. 2. Common bile duct dilatation. Could not exclude common bile duct stone. ERCP or MRCP may be helpful for further evaluation. 3. Normal sonographic appearance of the liver, spleen, pancreas and both kidneys. Electronically Signed   By: Rudie MeyerP.  Gallerani M.D.   On: 07/17/2015 18:40    Anti-infectives: Anti-infectives    None       Assessment/Plan Gallstone  pancreatitis -NPO (except ice), IVF, pain control, antiemetics, no antibiotics needed -Ambulate and IS -SCD's and lovenox -Lap chole with IOC this admission -Lipase improved, but ALT/AST increased, bili normal, wbc normal, recheck tomorrow -May need 1-2 more days before ready for OR, too much pain to proceed today       Nonie Hoyer 07/18/2015, 7:29 AM Pager: [phone]

## 2015-07-19 ENCOUNTER — Inpatient Hospital Stay (HOSPITAL_COMMUNITY): Payer: Self-pay

## 2015-07-19 ENCOUNTER — Inpatient Hospital Stay (HOSPITAL_COMMUNITY): Payer: MEDICAID | Admitting: Anesthesiology

## 2015-07-19 ENCOUNTER — Encounter (HOSPITAL_COMMUNITY): Admission: EM | Disposition: A | Discharge status: Home / Self Care | Payer: Self-pay | Source: Home / Self Care

## 2015-07-19 ENCOUNTER — Inpatient Hospital Stay (HOSPITAL_COMMUNITY): Payer: Self-pay | Admitting: Anesthesiology

## 2015-07-19 HISTORY — PX: CHOLECYSTECTOMY: SHX55

## 2015-07-19 LAB — SURGICAL PCR SCREEN
MRSA, PCR: NEGATIVE
Staphylococcus aureus: NEGATIVE

## 2015-07-19 LAB — COMPREHENSIVE METABOLIC PANEL
ALBUMIN: 3.2 g/dL — AB (ref 3.5–5.0)
ALT: 142 U/L — ABNORMAL HIGH (ref 14–54)
AST: 77 U/L — AB (ref 15–41)
Alkaline Phosphatase: 50 U/L (ref 38–126)
Anion gap: 10 (ref 5–15)
BILIRUBIN TOTAL: 1.1 mg/dL (ref 0.3–1.2)
BUN: 7 mg/dL (ref 6–20)
CHLORIDE: 111 mmol/L (ref 101–111)
CO2: 17 mmol/L — ABNORMAL LOW (ref 22–32)
Calcium: 8.6 mg/dL — ABNORMAL LOW (ref 8.9–10.3)
Creatinine, Ser: 0.68 mg/dL (ref 0.44–1.00)
GFR calc Af Amer: >60 mL/min (ref >60)
GFR calc non Af Amer: >60 mL/min (ref >60)
GLUCOSE: 48 mg/dL — AB (ref 65–99)
POTASSIUM: 4.2 mmol/L (ref 3.5–5.1)
Sodium: 138 mmol/L (ref 135–145)
TOTAL PROTEIN: 5.7 g/dL — AB (ref 6.5–8.1)

## 2015-07-19 LAB — CBC
HEMATOCRIT: 34.8 % — AB (ref 36.0–46.0)
Hemoglobin: 11.8 g/dL — ABNORMAL LOW (ref 12.0–15.0)
MCH: 31.1 pg (ref 26.0–34.0)
MCHC: 33.9 g/dL (ref 30.0–36.0)
MCV: 91.8 fL (ref 78.0–100.0)
Platelets: 203 10*3/uL (ref 150–400)
RBC: 3.79 MIL/uL — ABNORMAL LOW (ref 3.87–5.11)
RDW: 12.4 % (ref 11.5–15.5)
WBC: 4.9 10*3/uL (ref 4.0–10.5)

## 2015-07-19 LAB — LIPASE, BLOOD: Lipase: 23 U/L (ref 11–51)

## 2015-07-19 SURGERY — LAPAROSCOPIC CHOLECYSTECTOMY WITH INTRAOPERATIVE CHOLANGIOGRAM
Anesthesia: General | Site: Abdomen

## 2015-07-19 MED ORDER — 0.9 % SODIUM CHLORIDE (POUR BTL) OPTIME
TOPICAL | Status: DC | PRN
  Administered 2015-07-19: 1000 mL

## 2015-07-19 MED ORDER — PROMETHAZINE HCL 25 MG/ML IJ SOLN
6.2500 mg | INTRAMUSCULAR | Status: DC | PRN
Start: 2015-07-19 — End: 2015-07-19

## 2015-07-19 MED ORDER — ROCURONIUM BROMIDE 100 MG/10ML IV SOLN
INTRAVENOUS | Status: DC | PRN
  Administered 2015-07-19: 40 mg via INTRAVENOUS

## 2015-07-19 MED ORDER — LIDOCAINE HCL 4 % MT SOLN
OROMUCOSAL | Status: DC | PRN
  Administered 2015-07-19: 4 mL via TOPICAL

## 2015-07-19 MED ORDER — SODIUM CHLORIDE 0.9 % IV SOLN
INTRAVENOUS | Status: DC | PRN
  Administered 2015-07-19: 6 mL

## 2015-07-19 MED ORDER — FENTANYL CITRATE (PF) 250 MCG/5ML IJ SOLN
INTRAMUSCULAR | Status: AC
  Filled 2015-07-19: qty 5

## 2015-07-19 MED ORDER — MIDAZOLAM HCL 2 MG/2ML IJ SOLN
INTRAMUSCULAR | Status: AC
  Filled 2015-07-19: qty 4

## 2015-07-19 MED ORDER — BUPIVACAINE-EPINEPHRINE 0.25% -1:200000 IJ SOLN
INTRAMUSCULAR | Status: DC | PRN
  Administered 2015-07-19: 24 mL

## 2015-07-19 MED ORDER — LACTATED RINGERS IV SOLN
INTRAVENOUS | Status: DC
  Administered 2015-07-19: 12:00:00 via INTRAVENOUS

## 2015-07-19 MED ORDER — HYDROMORPHONE HCL 1 MG/ML IJ SOLN
0.2500 mg | INTRAMUSCULAR | Status: DC | PRN
  Administered 2015-07-19 (×4): 0.5 mg via INTRAVENOUS

## 2015-07-19 MED ORDER — ACETAMINOPHEN 500 MG PO TABS: 1000.0000 mg | ORAL_TABLET | Freq: Four times a day (QID) | ORAL | Status: DC | PRN

## 2015-07-19 MED ORDER — CEFOTETAN DISODIUM 1 G IJ SOLR
1.0000 g | INTRAMUSCULAR | Status: AC
  Administered 2015-07-19: 2 g via INTRAVENOUS
  Filled 2015-07-19: qty 1

## 2015-07-19 MED ORDER — GLYCOPYRROLATE 0.2 MG/ML IJ SOLN
INTRAMUSCULAR | Status: DC | PRN
Start: 2015-07-19 — End: 2015-07-19
  Administered 2015-07-19: 0.6 mg via INTRAVENOUS

## 2015-07-19 MED ORDER — DEXAMETHASONE SODIUM PHOSPHATE 4 MG/ML IJ SOLN
INTRAMUSCULAR | Status: DC | PRN
  Administered 2015-07-19: 4 mg via INTRAVENOUS

## 2015-07-19 MED ORDER — SODIUM CHLORIDE 0.9 % IR SOLN
Status: DC | PRN
  Administered 2015-07-19: 1000 mL

## 2015-07-19 MED ORDER — ENOXAPARIN SODIUM 40 MG/0.4ML ~~LOC~~ SOLN: 40.0000 mg | SUBCUTANEOUS | Status: DC

## 2015-07-19 MED ORDER — LIDOCAINE HCL (CARDIAC) 20 MG/ML IV SOLN
INTRAVENOUS | Status: DC | PRN
  Administered 2015-07-19: 60 mg via INTRAVENOUS

## 2015-07-19 MED ORDER — FENTANYL CITRATE (PF) 100 MCG/2ML IJ SOLN
INTRAMUSCULAR | Status: DC | PRN
  Administered 2015-07-19: 100 ug via INTRAVENOUS
  Administered 2015-07-19 (×2): 50 ug via INTRAVENOUS

## 2015-07-19 MED ORDER — HYDROMORPHONE HCL 1 MG/ML IJ SOLN
INTRAMUSCULAR | Status: AC
  Administered 2015-07-19: 0.5 mg via INTRAVENOUS
  Filled 2015-07-19: qty 1

## 2015-07-19 MED ORDER — CEFOTETAN DISODIUM-DEXTROSE 2-2.08 GM-% IV SOLR
INTRAVENOUS | Status: AC
  Filled 2015-07-19: qty 50

## 2015-07-19 MED ORDER — LACTATED RINGERS IV SOLN: INTRAVENOUS | Status: DC

## 2015-07-19 MED ORDER — PROPOFOL 10 MG/ML IV BOLUS
INTRAVENOUS | Status: AC
  Filled 2015-07-19: qty 20

## 2015-07-19 MED ORDER — ONDANSETRON HCL 4 MG/2ML IJ SOLN
INTRAMUSCULAR | Status: DC | PRN
  Administered 2015-07-19: 4 mg via INTRAVENOUS

## 2015-07-19 MED ORDER — MIDAZOLAM HCL 5 MG/5ML IJ SOLN
INTRAMUSCULAR | Status: DC | PRN
  Administered 2015-07-19: 2 mg via INTRAVENOUS

## 2015-07-19 MED ORDER — NEOSTIGMINE METHYLSULFATE 10 MG/10ML IV SOLN
INTRAVENOUS | Status: DC | PRN
  Administered 2015-07-19: 4 mg via INTRAVENOUS

## 2015-07-19 MED ORDER — SODIUM CHLORIDE 0.9 % IJ SOLN
INTRAMUSCULAR | Status: AC
  Filled 2015-07-19: qty 10

## 2015-07-19 MED ORDER — MEPERIDINE HCL 25 MG/ML IJ SOLN: 6.2500 mg | INTRAMUSCULAR | Status: DC | PRN

## 2015-07-19 MED ORDER — OXYCODONE HCL 5 MG PO TABS
5.0000 mg | ORAL_TABLET | ORAL | Status: DC | PRN
  Administered 2015-07-20 (×2): 10 mg via ORAL
  Filled 2015-07-19 (×2): qty 2

## 2015-07-19 MED ORDER — KETOROLAC TROMETHAMINE 30 MG/ML IJ SOLN
INTRAMUSCULAR | Status: DC | PRN
  Administered 2015-07-19: 30 mg via INTRAVENOUS

## 2015-07-19 MED ORDER — BUPIVACAINE-EPINEPHRINE (PF) 0.25% -1:200000 IJ SOLN
INTRAMUSCULAR | Status: AC
  Filled 2015-07-19: qty 30

## 2015-07-19 MED ORDER — VECURONIUM BROMIDE 10 MG IV SOLR
INTRAVENOUS | Status: AC
  Filled 2015-07-19: qty 10

## 2015-07-19 MED ORDER — PROPOFOL 10 MG/ML IV BOLUS
INTRAVENOUS | Status: DC | PRN
  Administered 2015-07-19: 160 mg via INTRAVENOUS

## 2015-07-19 SURGICAL SUPPLY — 49 items
APPLIER CLIP 5 13 M/L LIGAMAX5 (MISCELLANEOUS) ×3
BANDAGE ADH SHEER 1  50/CT (GAUZE/BANDAGES/DRESSINGS) ×9 IMPLANT
BENZOIN TINCTURE PRP APPL 2/3 (GAUZE/BANDAGES/DRESSINGS) ×3 IMPLANT
BLADE SURG ROTATE 9660 (MISCELLANEOUS) IMPLANT
BNDG COHESIVE 2X5 WHT NS (GAUZE/BANDAGES/DRESSINGS) ×9 IMPLANT
CANISTER SUCTION 2500CC (MISCELLANEOUS) ×3 IMPLANT
CHLORAPREP W/TINT 26ML (MISCELLANEOUS) ×3 IMPLANT
CLIP APPLIE 5 13 M/L LIGAMAX5 (MISCELLANEOUS) ×1 IMPLANT
CLOSURE WOUND 1/2 X4 (GAUZE/BANDAGES/DRESSINGS) ×1
COVER MAYO STAND STRL (DRAPES) ×3 IMPLANT
COVER SURGICAL LIGHT HANDLE (MISCELLANEOUS) ×3 IMPLANT
DRAPE C-ARM 42X72 X-RAY (DRAPES) ×3 IMPLANT
DRSG TEGADERM 4X4.75 (GAUZE/BANDAGES/DRESSINGS) ×3 IMPLANT
ELECT REM PT RETURN 9FT ADLT (ELECTROSURGICAL) ×3
ELECTRODE REM PT RTRN 9FT ADLT (ELECTROSURGICAL) ×1 IMPLANT
GAUZE SPONGE 2X2 8PLY STRL LF (GAUZE/BANDAGES/DRESSINGS) ×1 IMPLANT
GLOVE BIO SURGEON STRL SZ8.5 (GLOVE) ×3 IMPLANT
GLOVE BIOGEL M STRL SZ7.5 (GLOVE) ×3 IMPLANT
GLOVE BIOGEL PI IND STRL 7.0 (GLOVE) ×1 IMPLANT
GLOVE BIOGEL PI IND STRL 8 (GLOVE) ×1 IMPLANT
GLOVE BIOGEL PI IND STRL 8.5 (GLOVE) ×1 IMPLANT
GLOVE BIOGEL PI INDICATOR 7.0 (GLOVE) ×2
GLOVE BIOGEL PI INDICATOR 8 (GLOVE) ×2
GLOVE BIOGEL PI INDICATOR 8.5 (GLOVE) ×2
GLOVE SURG SS PI 7.0 STRL IVOR (GLOVE) ×3 IMPLANT
GOWN STRL REUS W/ TWL LRG LVL3 (GOWN DISPOSABLE) ×3 IMPLANT
GOWN STRL REUS W/ TWL XL LVL3 (GOWN DISPOSABLE) ×1 IMPLANT
GOWN STRL REUS W/TWL LRG LVL3 (GOWN DISPOSABLE) ×6
GOWN STRL REUS W/TWL XL LVL3 (GOWN DISPOSABLE) ×2
KIT BASIN OR (CUSTOM PROCEDURE TRAY) ×3 IMPLANT
KIT ROOM TURNOVER OR (KITS) ×3 IMPLANT
NS IRRIG 1000ML POUR BTL (IV SOLUTION) ×3 IMPLANT
PAD ARMBOARD 7.5X6 YLW CONV (MISCELLANEOUS) ×3 IMPLANT
POUCH RETRIEVAL ECOSAC 10 (ENDOMECHANICALS) ×1 IMPLANT
POUCH RETRIEVAL ECOSAC 10MM (ENDOMECHANICALS) ×2
SCISSORS LAP 5X35 DISP (ENDOMECHANICALS) ×3 IMPLANT
SET CHOLANGIOGRAPH 5 50 .035 (SET/KITS/TRAYS/PACK) ×3 IMPLANT
SET IRRIG TUBING LAPAROSCOPIC (IRRIGATION / IRRIGATOR) ×3 IMPLANT
SLEEVE ENDOPATH XCEL 5M (ENDOMECHANICALS) ×6 IMPLANT
SPECIMEN JAR SMALL (MISCELLANEOUS) ×3 IMPLANT
SPONGE GAUZE 2X2 STER 10/PKG (GAUZE/BANDAGES/DRESSINGS) ×2
STRIP CLOSURE SKIN 1/2X4 (GAUZE/BANDAGES/DRESSINGS) ×2 IMPLANT
SUT MNCRL AB 4-0 PS2 18 (SUTURE) ×3 IMPLANT
TOWEL OR 17X24 6PK STRL BLUE (TOWEL DISPOSABLE) ×3 IMPLANT
TOWEL OR 17X26 10 PK STRL BLUE (TOWEL DISPOSABLE) ×3 IMPLANT
TRAY LAPAROSCOPIC MC (CUSTOM PROCEDURE TRAY) ×3 IMPLANT
TROCAR XCEL BLUNT TIP 100MML (ENDOMECHANICALS) ×3 IMPLANT
TROCAR XCEL NON-BLD 5MMX100MML (ENDOMECHANICALS) ×3 IMPLANT
TUBING INSUFFLATION (TUBING) ×3 IMPLANT

## 2015-07-19 NOTE — Anesthesia Procedure Notes (Signed)
Procedure Name: Intubation Date/Time: 07/19/2015 1:49 PM Performed by: Daiva EvesAVENEL, Tanairi Cypert W Pre-anesthesia Checklist: Patient identified, Timeout performed, Emergency Drugs available, Patient being monitored and Suction available Patient Re-evaluated:Patient Re-evaluated prior to inductionOxygen Delivery Method: Circle system utilized Intubation Type: IV induction Ventilation: Mask ventilation without difficulty Laryngoscope Size: Mac and 3 Grade View: Grade I Tube size: 7.5 mm Number of attempts: 1 Placement Confirmation: breath sounds checked- equal and bilateral,  positive ETCO2,  ETT inserted through vocal cords under direct vision and CO2 detector Secured at: 22 cm Tube secured with: Tape Dental Injury: Teeth and Oropharynx as per pre-operative assessment

## 2015-07-19 NOTE — Op Note (Signed)
Molly Beard [id number] 23-Aug-1986 07/19/2015  Laparoscopic Cholecystectomy with IOC Procedure Note  Indications: This patient presents with symptomatic gallbladder disease and gallstone pancreatitis and will undergo laparoscopic cholecystectomy.  Pre-operative Diagnosis: gallstone pancreatitis  Post-operative Diagnosis: Same  Surgeon: Atilano Ina   Assistants: none  Anesthesia: General endotracheal anesthesia  ASA Class: 1  Procedure Details  The patient was seen again in the Holding Room. The risks, benefits, complications, treatment options, and expected outcomes were discussed with the patient. The possibilities of reaction to medication, pulmonary aspiration, perforation of viscus, bleeding, recurrent infection, finding a normal gallbladder, the need for additional procedures, failure to diagnose a condition, the possible need to convert to an open procedure, and creating a complication requiring transfusion or operation were discussed with the patient. The likelihood of improving the patient's symptoms with return to their baseline status is good.  The patient and/or family concurred with the proposed plan, giving informed consent. The site of surgery properly noted. The patient was taken to Operating Room, identified as Molly Beard and the procedure verified as Laparoscopic Cholecystectomy with Intraoperative Cholangiogram. A Time Out was held and the above information confirmed. Antibiotic prophylaxis was administered.   Prior to the induction of general anesthesia, antibiotic prophylaxis was administered. General endotracheal anesthesia was then administered and tolerated well. After the induction, the abdomen was prepped with Chloraprep and draped in the sterile fashion. The patient was positioned in the supine position.  Local anesthetic agent was injected into the skin near the umbilicus and an incision made. We dissected down to the abdominal fascia with blunt dissection.   The fascia was incised vertically and we entered the peritoneal cavity bluntly.  A pursestring suture of 0-Vicryl was placed around the fascial opening.  The Hasson cannula was inserted and secured with the stay suture.  Pneumoperitoneum was then created with CO2 and tolerated well without any adverse changes in the patient's vital signs. An 5-mm port was placed in the subxiphoid position.  Two 5-mm ports were placed in the right upper quadrant. All skin incisions were infiltrated with a local anesthetic agent before making the incision and placing the trocars.   We positioned the patient in reverse Trendelenburg, tilted slightly to the patient's left.  The gallbladder was identified, the fundus grasped and retracted cephalad. Adhesions were lysed bluntly and with the electrocautery where indicated, taking care not to injure any adjacent organs or viscus. The infundibulum was grasped and retracted laterally, exposing the peritoneum overlying the triangle of Calot. This was then divided and exposed in a blunt fashion. A critical view of the cystic duct and cystic artery was obtained.  The cystic duct was clearly identified and bluntly dissected circumferentially. The cystic duct was ligated with a clip distally.   An incision was made in the cystic duct and the [facility] cholangiogram catheter introduced. The catheter was secured using a clip. A cholangiogram was then obtained which showed good visualization of the distal and proximal biliary tree with no sign of filling defects or obstruction.  Contrast flowed easily into the duodenum. The catheter was then removed.   The cystic duct was then ligated with clips and divided. The cystic artery which had been identified & dissected free was ligated with clips and divided as well.   The gallbladder was dissected from the liver bed in retrograde fashion with the electrocautery. The gallbladder was removed and placed in an Endocatch sac.  The gallbladder and Endocatch  sac were then removed through the umbilical  port site. The liver bed was irrigated and inspected. Hemostasis was achieved with the electrocautery. Copious irrigation was utilized and was repeatedly aspirated until clear.  The pursestring suture was used to close the umbilical fascia.    We again inspected the right upper quadrant for hemostasis.  The umbilical closure was inspected and there was no air leak and nothing trapped within the closure. Pneumoperitoneum was released as we removed the trocars.  4-0 Monocryl was used to close the skin.   Benzoin, steri-strips, and clean dressings were applied. The patient was then extubated and brought to the recovery room in stable condition. Instrument, sponge, and needle counts were correct at closure and at the conclusion of the case.   Findings:  Cholelithiasis; +critical view  Estimated Blood Loss: Minimal         Drains: none         Specimens: Gallbladder           Complications: None; patient tolerated the procedure well.         Disposition: PACU - hemodynamically stable.         Condition: stable  Mary SellaEric M. Andrey CampanileWilson, MD, FACS General, Bariatric, & Minimally Invasive Surgery [facility] [place]  \

## 2015-07-19 NOTE — Progress Notes (Signed)
  Subjective: No pain. No n/v.   Objective: Vital signs in last 24 hours: Temp:  [98.3 F (36.8 C)-99.3 F (37.4 C)] 99.3 F (37.4 C) (11/03 0514) Pulse Rate:  [60-77] 77 (11/03 0514) Resp:  [16-18] 16 (11/03 0514) BP: (99-111)/(47-72) 99/47 mmHg (11/03 0514) SpO2:  [98 %-100 %] 98 % (11/03 0514) Last BM Date: 07/17/15  Intake/Output from previous day:   Intake/Output this shift:    Asleep, easily arousable cta b/l Reg Soft, nt, nd, No edema  Lab Results:   Recent Labs  07/18/15 0354 07/19/15 0428  WBC 5.2 4.9  HGB 11.8* 11.8*  HCT 35.3* 34.8*  PLT 227 203   BMET  Recent Labs  07/18/15 0354 07/19/15 0428  NA 137 138  K 4.2 4.2  CL 108 111  CO2 25 17*  GLUCOSE 104* 48*  BUN <5* 7  CREATININE 0.75 0.68  CALCIUM 8.5* 8.6*   PT/INR No results for input(s): LABPROT, INR in the last 72 hours. ABG No results for input(s): PHART, HCO3 in the last 72 hours.  Invalid input(s): PCO2, PO2  Studies/Results: [place] Abdomen Complete  07/17/2015  CLINICAL DATA:  Acute right upper quadrant abdominal pain and elevated lipase. EXAM: ULTRASOUND ABDOMEN COMPLETE COMPARISON:  CT scan 11/25/2011 FINDINGS: Gallbladder: Numerous mobile gallstones and echogenic sludge with acoustic shadowing. No gallbladder wall thickening. There may be a small amount of pericholecystic fluid. Negative sonographic Murphy sign. Common bile duct: Diameter: 7-8 mm Liver: Normal echogenicity without focal lesion or biliary dilatation. IVC: Normal caliber Pancreas: Sonographically unremarkable Spleen: Normal size and echogenicity without focal lesions Right Kidney: Length: 10.5 cm. Normal renal cortical thickness and echogenicity without focal lesions or hydronephrosis. Left Kidney: Length: 10.0 cm. Normal renal cortical thickness and echogenicity without focal lesions or hydronephrosis. Abdominal aorta: Normal caliber Other findings: None. IMPRESSION: 1. Cholelithiasis without definite findings for acute  cholecystitis. 2. Common bile duct dilatation. Could not exclude common bile duct stone. ERCP or MRCP may be helpful for further evaluation. 3. Normal sonographic appearance of the liver, spleen, pancreas and both kidneys. Electronically Signed   By: Rudie MeyerP.  Gallerani M.D.   On: 07/17/2015 18:40    Anti-infectives: Anti-infectives    Start     Dose/Rate Route Frequency Ordered Stop   07/19/15 0830  cefoTEtan (CEFOTAN) 1 g in dextrose 5 % 50 mL IVPB     1 g 100 mL/hr over 30 Minutes Intravenous On call 07/19/15 0828 07/20/15 0830      Assessment/Plan: Gallstone pancreatitis Labs normalizing, no pain, vitals ok. Believe she is ready for OR  We discussed gallbladder disease.  I discussed laparoscopic cholecystectomy with IOC in detail.  The patient was shown diagrams detailing the procedure.  We discussed the risks and benefits of a laparoscopic cholecystectomy including, but not limited to bleeding, infection, injury to surrounding structures such as the intestine or liver, bile leak, retained gallstones, need to convert to an open procedure, prolonged diarrhea, blood clots such as  DVT, common bile duct injury, anesthesia risks, and possible need for additional procedures.  We discussed the typical post-operative recovery course. I explained that the likelihood of improvement of their symptoms is good. Did discuss possibility of need for ERCP postop  abx on call to OR  Mary SellaEric M. Andrey CampanileWilson, MD, FACS General, Bariatric, & Minimally Invasive Surgery [facility] [place]   LOS: 1 day    Atilano InaWILSON,Deari Sessler M 07/19/2015

## 2015-07-19 NOTE — Anesthesia Preprocedure Evaluation (Addendum)
Anesthesia Evaluation  Patient identified by MRN, date of birth, ID band Patient awake    Reviewed: Allergy & Precautions, NPO status , Patient's Chart, lab work & pertinent test results  Airway Mallampati: I  TM Distance: >3 FB Neck ROM: Full    Dental  (+) Teeth Intact   Pulmonary Current Smoker,    breath sounds clear to auscultation       Cardiovascular negative cardio ROS   Rhythm:Regular Rate:Normal     Neuro/Psych negative neurological ROS  negative psych ROS   GI/Hepatic Neg liver ROS, GERD  ,  Endo/Other  negative endocrine ROS  Renal/GU negative Renal ROS  negative genitourinary   Musculoskeletal negative musculoskeletal ROS (+)   Abdominal   Peds negative pediatric ROS (+)  Hematology negative hematology ROS (+)   Anesthesia Other Findings   Reproductive/Obstetrics negative OB ROS                            Lab Results  Component Value Date   WBC 4.9 07/19/2015   HGB 11.8* 07/19/2015   HCT 34.8* 07/19/2015   MCV 91.8 07/19/2015   PLT 203 07/19/2015   Lab Results  Component Value Date   CREATININE 0.68 07/19/2015   BUN 7 07/19/2015   NA 138 07/19/2015   K 4.2 07/19/2015   CL 111 07/19/2015   CO2 17* 07/19/2015   No results found for: INR, PROTIME   Anesthesia Physical Anesthesia Plan  ASA: II  Anesthesia Plan: General   Post-op Pain Management:    Induction: Intravenous  Airway Management Planned: Oral ETT  Additional Equipment:   Intra-op Plan:   Post-operative Plan: Extubation in OR  Informed Consent: I have reviewed the patients History and Physical, chart, labs and discussed the procedure including the risks, benefits and alternatives for the proposed anesthesia with the patient or authorized representative who has indicated his/her understanding and acceptance.   Dental advisory given  Plan Discussed with: CRNA  Anesthesia Plan Comments:          Anesthesia Quick Evaluation

## 2015-07-19 NOTE — Transfer of Care (Signed)
Immediate Anesthesia Transfer of Care Note  Patient: Molly LintsJodi M Wheatley  Procedure(s) Performed: Procedure(s): LAPAROSCOPIC CHOLECYSTECTOMY WITH INTRAOPERATIVE CHOLANGIOGRAM (N/A)  Patient Location: PACU  Anesthesia Type:General  Level of Consciousness: awake, alert  and oriented  Airway & Oxygen Therapy: Patient Spontanous Breathing and Patient connected to nasal cannula oxygen  Post-op Assessment: Report given to RN, Post -op Vital signs reviewed and stable and Patient moving all extremities X 4  Post vital signs: Reviewed and stable  Last Vitals:  Filed Vitals:   07/19/15 1129  BP: 114/48  Pulse: 80  Temp: 37 C  Resp: 16    Complications: No apparent anesthesia complications

## 2015-07-19 NOTE — Anesthesia Postprocedure Evaluation (Signed)
  Anesthesia Post-op Note  Patient: Molly Beard  Procedure(s) Performed: Procedure(s): LAPAROSCOPIC CHOLECYSTECTOMY WITH INTRAOPERATIVE CHOLANGIOGRAM (N/A)  Patient Location: PACU  Anesthesia Type:General  Level of Consciousness: awake, alert  and oriented  Airway and Oxygen Therapy: Patient Spontanous Breathing  Post-op Pain: mild  Post-op Assessment: Post-op Vital signs reviewed and Patient's Cardiovascular Status Stable              Post-op Vital Signs: Reviewed and stable  Last Vitals:  Filed Vitals:   07/19/15 1545  BP:   Pulse:   Temp: 36.5 C  Resp:     Complications: No apparent anesthesia complications

## 2015-07-19 NOTE — Progress Notes (Signed)
Inpatient Diabetes Program Recommendations  AACE/ADA: New Consensus Statement on Inpatient Glycemic Control (2015)  Target Ranges:  Prepandial:   less than 140 mg/dL      Peak postprandial:   less than 180 mg/dL (1-2 hours)      Critically ill patients:  140 - 180 mg/dL  Results for Molly LintsLONDON, Molly Beard (MRN [id number]) as of 07/19/2015 10:20  Ref. Range 07/17/2015 15:08 07/18/2015 03:54 07/19/2015 04:28  Glucose Latest Ref Range: 65-99 mg/dL 97 409104 (H) 48 (L)   Review of Glycemic Control  Diabetes history: NO Outpatient Diabetes medications: NA Current orders for Inpatient glycemic control: NONE  Inpatient Diabetes Program Recommendations: IV fluids: Lab glucose 48 mg/dl this morning at 8:114:28 am. Patient is currently NPO. May want to consider adding dextrose to IV fluids if appropriate.  Thanks, Orlando PennerMarie Deren Degrazia, RN, MSN, CDE Diabetes Coordinator Inpatient Diabetes Program [phone] (Team Pager from 8am to 5pm) [phone] ([facility] office) [phone] [facility] office) [phone] [facility] office)

## 2015-07-20 ENCOUNTER — Encounter (HOSPITAL_COMMUNITY): Payer: Self-pay | Admitting: General Surgery

## 2015-07-20 MED ORDER — HYDROCODONE-ACETAMINOPHEN 5-325 MG PO TABS: 1.0000 | ORAL_TABLET | Freq: Four times a day (QID) | ORAL | Status: DC | PRN

## 2015-07-20 NOTE — Discharge Instructions (Signed)
[facility] ______[facility] LAPAROSCOPIC SURGERY: POST OP INSTRUCTIONS Always review your discharge instruction sheet given to you by the facility where your surgery was performed. IF YOU HAVE DISABILITY OR FAMILY LEAVE FORMS, YOU MUST BRING THEM TO THE OFFICE FOR PROCESSING.   DO NOT GIVE THEM TO YOUR DOCTOR.  1. A prescription for pain medication may be given to you upon discharge.  Take your pain medication as prescribed, if needed.  If narcotic pain medicine is not needed, then you may take acetaminophen (Tylenol) or ibuprofen (Advil) as needed. 2. Take your usually prescribed medications unless otherwise directed. 3. If you need a refill on your pain medication, please contact your pharmacy.  They will contact our office to request authorization. Prescriptions will not be filled after 5pm or on week-ends. 4. You should follow a light diet the first few days after arrival home, such as soup and crackers, etc.  Be sure to include lots of fluids daily. 5. Most patients will experience some swelling and bruising in the area of the incisions.  Ice packs will help.  Swelling and bruising can take several days to resolve.  6. It is common to experience some constipation if taking pain medication after surgery.  Increasing fluid intake and taking a stool softener (such as Colace) will usually help or prevent this problem from occurring.  A mild laxative (Milk of Magnesia or Miralax) should be taken according to package instructions if there are no bowel movements after 48 hours. 7. Unless discharge instructions indicate otherwise, you may remove your bandages 24-48 hours after surgery, and you may shower at that time.  You may have steri-strips (small skin tapes) in place directly over the incision.  These strips should be left on the skin for 7-10 days.  If your surgeon used skin glue on the incision, you may shower in 24 hours.  The glue will flake off over the next 2-3 weeks.  Any sutures or  staples will be removed at the office during your follow-up visit. 8. ACTIVITIES:  You may resume regular (light) daily activities beginning the next day--such as daily self-care, walking, climbing stairs--gradually increasing activities as tolerated.  You may have sexual intercourse when it is comfortable.  Refrain from any heavy lifting or straining until approved by your doctor. a. You may drive when you are no longer taking prescription pain medication, you can comfortably wear a seatbelt, and you can safely maneuver your car and apply brakes. 9. RETURN TO WORK:  No lifting, pushing, pulling greater than 15lbs for two weeks. You should see your doctor in the office for a follow-up appointment approximately 2-3 weeks after your surgery.  Make sure that you call for this appointment within a day or two after you arrive home to insure a convenient appointment time. 10. OTHER INSTRUCTIONS: Follow up appointment in office on 08/07/2015 @ 1:00pm.____________________________ __________________________________________________________________________________________________________________________ WHEN TO CALL YOUR DOCTOR: 1. Fever over 101.0 2. Inability to urinate 3. Continued bleeding from incision. 4. Increased pain, redness, or drainage from the incision. 5. Increasing abdominal pain  The clinic staff is available to answer your questions during regular business hours.  Please dont hesitate to call and ask to speak to one of the nurses for clinical concerns.  If you have a medical emergency, go to the nearest emergency room or call 911.  A surgeon from [facility] is always on call at the hospital. [address], [address], [place], [place]  [id number] ? [address]  [place], [place]   [address] [phone] ? [phone] ? FAX [phone] Web site: [email or web address]  

## 2015-07-20 NOTE — Progress Notes (Signed)
1 Day Post-Op  [facility]  Progress Note  Subjective: POD #1 from laparoscopic cholecystectomy with IOC. Doing very well this morning. Is ambulatory.Tolerated regular diet this morning. Urinating without issue and passing flatus. No BM yet. Expected incisional tenderness only. No N/V. Surg path pending.  Objective: Vital signs in last 24 hours: Temp:  [97.7 F (36.5 C)-98.7 F (37.1 C)] 98.4 F (36.9 C) (11/04 0556) Pulse Rate:  [54-85] 54 (11/04 0556) Resp:  [13-19] 18 (11/04 0556) BP: (100-114)/(46-66) 105/46 mmHg (11/04 0556) SpO2:  [98 %-100 %] 100 % (11/04 0556) Last BM Date: 07/17/15  Intake/Output from previous day: 11/03 0701 - 11/04 0700 In: 2603 [P.O.:480; I.V.:2123] Out: 850 [Urine:850] Intake/Output this shift:    PE: Gen: seated comfortably in bed. NAD CV: RRR Pulm: CTA B/L Abd: soft, minimal distension, incisions with mild expected incisional tenderness and dressings dry. Normoactive BS.   Lab Results:   Recent Labs  07/18/15 0354 07/19/15 0428  WBC 5.2 4.9  HGB 11.8* 11.8*  HCT 35.3* 34.8*  PLT 227 203   BMET  Recent Labs  07/18/15 0354 07/19/15 0428  NA 137 138  K 4.2 4.2  CL 108 111  CO2 25 17*  GLUCOSE 104* 48*  BUN <5* 7  CREATININE 0.75 0.68  CALCIUM 8.5* 8.6*   PT/INR No results for input(s): LABPROT, INR in the last 72 hours. CMP     Component Value Date/Time   NA 138 07/19/2015 0428   K 4.2 07/19/2015 0428   CL 111 07/19/2015 0428   CO2 17* 07/19/2015 0428   GLUCOSE 48* 07/19/2015 0428   BUN 7 07/19/2015 0428   CREATININE 0.68 07/19/2015 0428   CALCIUM 8.6* 07/19/2015 0428   PROT 5.7* 07/19/2015 0428   ALBUMIN 3.2* 07/19/2015 0428   AST 77* 07/19/2015 0428   ALT 142* 07/19/2015 0428   ALKPHOS 50 07/19/2015 0428   BILITOT 1.1 07/19/2015 0428   GFRNONAA >60 07/19/2015 0428   GFRAA >60 07/19/2015 0428   Lipase     Component Value Date/Time   LIPASE 23 07/19/2015 0428       Studies/Results: Dg  Cholangiogram Operative  07/19/2015  CLINICAL DATA:  Gallstone pancreatitis. EXAM: INTRAOPERATIVE CHOLANGIOGRAM TECHNIQUE: Cholangiographic images from the C-arm fluoroscopic device were submitted for interpretation post-operatively. Please see the procedural report for the amount of contrast and the fluoroscopy time utilized. COMPARISON:  Ultrasound 07/17/2015 FINDINGS: The biliary system is patent. A small amount reflux into the intrahepatic biliary system. Contrast drains into the duodenum. No obstructive filling defects or stones. Mild dilatation of the extrahepatic bile duct. Subtle filling defects in distal common bile duct and proximal common hepatic duct region. This could represent nonocclusive sludge. IMPRESSION: Question a small amount of nonocclusive sludge in the biliary system. No evidence for an obstructing stone. Electronically Signed   By: Richarda Overlie M.D.   On: 07/19/2015 14:35    Anti-infectives: Anti-infectives    Start     Dose/Rate Route Frequency Ordered Stop   07/19/15 1326  cefoTEtan in Dextrose 5% (CEFOTAN) 2-2.08 GM-% IVPB    Comments:  Leandro Reasoner   : cabinet override      07/19/15 1326 07/20/15 0144   07/19/15 1200  [MAR Hold]  cefoTEtan (CEFOTAN) 1 g in dextrose 5 % 50 mL IVPB     (MAR Hold since 07/19/15 1154)   1 g 100 mL/hr over 30 Minutes Intravenous To [facility] 07/19/15 0828 07/19/15 1345  Assessment/Plan  POD #1 s/p laparoscopic cholecystectomy with IOC.  LOS: 2 days   D/C IV/IVF Regular diet Ambulate ad lib Anticipate D/C home today with routine post op instructions and office follow up. Will discuss above with Dr. Andrey CampanileWilson.     Narda BondsLEE Darice Vicario, [facility] 07/20/2015, 9:26 AM

## 2015-07-20 NOTE — Discharge Summary (Signed)
Patient ID: Molly LintsJodi M Poplar MRN: [id number] DOB/AGE: 13-May-1986 29 y.o.  Admit date: 07/17/2015 Discharge date: 07/20/2015  Procedures: Laparoscopic cholecystectomy with intraoperative cholangiogram   Consults: None  Reason for Admission: gallstone pancreatitis   Admission Diagnoses: gallstone pancreatitis  Hospital Course: Patient admitted on 07/17/2015 with abdominal pain attributable to cholelithiasis with associated pancreatitis based upon abdominal U/S on 07/17/2015 and lab work consistent with elevated LFTs and lipase. Underwent uncomplicated laparoscopic cholecystectomy with Dr. Andrey CampanileWilson on 07/19/2015. IOC unremarkable. Uncomplicated postoperative recovery and labs improving. Discharged to home with her husband on POD #1.   Discharge Diagnoses:  Active Problems:   Gallstone pancreatitis s/p lap chole  Discharge Medications:   Medication List    STOP taking these medications        acetaminophen 325 MG tablet  Commonly known as:  TYLENOL     cyclobenzaprine 10 MG tablet  Commonly known as:  FLEXERIL     doxycycline 100 MG capsule  Commonly known as:  VIBRAMYCIN     ibuprofen 200 MG tablet  Commonly known as:  ADVIL,MOTRIN     predniSONE 10 MG tablet  Commonly known as:  DELTASONE     traMADol 50 MG tablet  Commonly known as:  ULTRAM      TAKE these medications        HYDROcodone-acetaminophen 5-325 MG tablet  Commonly known as:  NORCO  Take 1-2 tablets by mouth every 6 (six) hours as needed for moderate pain.     ranitidine 150 MG tablet  Commonly known as:  ZANTAC  Take 300 mg by mouth 2 (two) times daily as needed for heartburn.        Discharge Instructions:     Follow-up Information    Follow up with [facility] On 08/07/2015.   Specialty:  General Surgery   Why:  [facility] the Week [facility], 1:30pm, arrive no later than 1:00pm for paperwork   Contact information:   [address] [place] [id number] [phone]        Signed: Narda BondsLEE Znya Albino, [facility]  [facility] 07/20/2015, 10:54 AM

## 2015-07-20 NOTE — Progress Notes (Signed)
Pt given discharge paperwork, prescription, and letter for work. Pt verbally stated she understood the discharge instructions. Husband at bedside also listened to instructions and stated that he understood. Pt gathering all belongings.

## 2015-07-20 NOTE — Care Management Note (Signed)
Case Management Note  Patient Details  Name: Rosamaria LintsJodi M Ruder MRN: [id number] Date of Birth: 1986/08/09  Subjective/Objective:                    Action/Plan:   Expected Discharge Date:                  Expected Discharge Plan:  Home/Self Care  In-House Referral:     Discharge planning Services  CM Consult, Medication Assistance, MATCH Program  Post Acute Care Choice:    Choice offered to:  Patient  DME Arranged:    DME Agency:     HH Arranged:    HH Agency:     Status of Service:  Completed, signed off  Medicare Important Message Given:    Date Medicare IM Given:    Medicare IM give by:    Date Additional Medicare IM Given:    Additional Medicare Important Message give by:     If discussed at Long Length of Stay Meetings, dates discussed:    Additional Comments:  Kingsley PlanWile, Hawkins Seaman Marie, RN 07/20/2015, 12:08 PM

## 2016-01-01 ENCOUNTER — Emergency Department (HOSPITAL_COMMUNITY)
Admission: EM | Admit: 2016-01-01 | Discharge: 2016-01-01 | Disposition: A | Discharge status: Home / Self Care | Payer: MEDICAID | Attending: Emergency Medicine | Admitting: Emergency Medicine

## 2016-01-01 ENCOUNTER — Encounter (HOSPITAL_COMMUNITY): Payer: Self-pay | Admitting: Emergency Medicine

## 2016-01-01 DIAGNOSIS — K219 Gastro-esophageal reflux disease without esophagitis: Secondary | ICD-10-CM | POA: Insufficient documentation

## 2016-01-01 DIAGNOSIS — F17210 Nicotine dependence, cigarettes, uncomplicated: Secondary | ICD-10-CM | POA: Insufficient documentation

## 2016-01-01 DIAGNOSIS — R1031 Right lower quadrant pain: Secondary | ICD-10-CM | POA: Insufficient documentation

## 2016-01-01 DIAGNOSIS — R1030 Lower abdominal pain, unspecified: Secondary | ICD-10-CM

## 2016-01-01 DIAGNOSIS — R110 Nausea: Secondary | ICD-10-CM | POA: Insufficient documentation

## 2016-01-01 DIAGNOSIS — Z9049 Acquired absence of other specified parts of digestive tract: Secondary | ICD-10-CM | POA: Insufficient documentation

## 2016-01-01 DIAGNOSIS — R1032 Left lower quadrant pain: Secondary | ICD-10-CM | POA: Insufficient documentation

## 2016-01-01 DIAGNOSIS — R140 Abdominal distension (gaseous): Secondary | ICD-10-CM | POA: Insufficient documentation

## 2016-01-01 DIAGNOSIS — Z3202 Encounter for pregnancy test, result negative: Secondary | ICD-10-CM | POA: Insufficient documentation

## 2016-01-01 LAB — COMPREHENSIVE METABOLIC PANEL
ALK PHOS: 60 U/L (ref 38–126)
ALT: 12 U/L — AB (ref 14–54)
AST: 18 U/L (ref 15–41)
Albumin: 4 g/dL (ref 3.5–5.0)
Anion gap: 7 (ref 5–15)
BUN: 7 mg/dL (ref 6–20)
CALCIUM: 9.2 mg/dL (ref 8.9–10.3)
CHLORIDE: 106 mmol/L (ref 101–111)
CO2: 26 mmol/L (ref 22–32)
CREATININE: 0.7 mg/dL (ref 0.44–1.00)
GFR calc non Af Amer: >60 mL/min (ref >60)
GLUCOSE: 116 mg/dL — AB (ref 65–99)
Potassium: 4 mmol/L (ref 3.5–5.1)
SODIUM: 139 mmol/L (ref 135–145)
Total Bilirubin: 0.4 mg/dL (ref 0.3–1.2)
Total Protein: 6.9 g/dL (ref 6.5–8.1)

## 2016-01-01 LAB — URINALYSIS, ROUTINE W REFLEX MICROSCOPIC
BILIRUBIN URINE: NEGATIVE
GLUCOSE, UA: NEGATIVE mg/dL
Ketones, ur: NEGATIVE mg/dL
Nitrite: NEGATIVE
PROTEIN: NEGATIVE mg/dL
Specific Gravity, Urine: 1.013 (ref 1.005–1.030)
pH: 7.5 (ref 5.0–8.0)

## 2016-01-01 LAB — CBC
HCT: 35.4 % — ABNORMAL LOW (ref 36.0–46.0)
Hemoglobin: 11.9 g/dL — ABNORMAL LOW (ref 12.0–15.0)
MCH: 30 pg (ref 26.0–34.0)
MCHC: 33.6 g/dL (ref 30.0–36.0)
MCV: 89.2 fL (ref 78.0–100.0)
PLATELETS: 252 10*3/uL (ref 150–400)
RBC: 3.97 MIL/uL (ref 3.87–5.11)
RDW: 12.9 % (ref 11.5–15.5)
WBC: 5.1 10*3/uL (ref 4.0–10.5)

## 2016-01-01 LAB — URINE MICROSCOPIC-ADD ON

## 2016-01-01 LAB — LIPASE, BLOOD: LIPASE: 24 U/L (ref 11–51)

## 2016-01-01 LAB — I-STAT BETA HCG BLOOD, ED (MC, WL, AP ONLY)

## 2016-01-01 MED ORDER — ONDANSETRON HCL 4 MG PO TABS: 4.0000 mg | ORAL_TABLET | Freq: Four times a day (QID) | ORAL | Status: DC

## 2016-01-01 MED ORDER — DICYCLOMINE HCL 20 MG PO TABS: 20.0000 mg | ORAL_TABLET | Freq: Two times a day (BID) | ORAL | Status: DC

## 2016-01-01 MED ORDER — DICYCLOMINE HCL 10 MG PO CAPS
10.0000 mg | ORAL_CAPSULE | Freq: Once | ORAL | Status: AC
  Administered 2016-01-01: 10 mg via ORAL
  Filled 2016-01-01: qty 1

## 2016-01-01 NOTE — ED Provider Notes (Signed)
CSN: [id number]     Arrival date & time 01/01/16  1404 History  By signing my name below, I, Molly Beard, attest that this documentation has been prepared under the direction and in the presence of [employer], PA-C.  Electronically Signed: Tanda Beard, ED Scribe. 01/01/2016. 4:49 PM.   Chief Complaint  Patient presents with  . Abdominal Pain   The history is provided by the patient. No language interpreter was used.     HPI Comments: Molly Beard is a 30 y.o. female who presents to the Emergency Department complaining of sudden onset, intermittent, sharp, bilateral lower abdominal pain radiating to right lower back x 3 weeks, worsening over the past 2 days. She states she has this pain approximately 5x per hour that lasts 5 minutes prior to subsiding on its own. Her pain is alleviated while laying in the fetal position. Pt also complains of abdominal distension that began yesterday, prompting her to come to the ED. She also complains of nausea. Pt reports similar symptoms in the past.   Pt has been taking 800 mg Ibuprofen and 650 mg Tylenol with mild relief. Denies fever, appetite changes, vomiting, diarrhea, constipation, melena, hematochezia, dysuria, frequency, urgency, vaginal discharge, or any other associated symptoms. PSHx tubal ligation and cholecystectomy.  No other abdominal surgeries.  LNMP: 1 month ago.   Past Medical History  Diagnosis Date  . GERD (gastroesophageal reflux disease)    Past Surgical History  Procedure Laterality Date  . Tubal ligation  2010  . Tubal ligation    . Cholecystectomy N/A 07/19/2015    Procedure: LAPAROSCOPIC CHOLECYSTECTOMY WITH INTRAOPERATIVE CHOLANGIOGRAM;  Surgeon: Gaynelle Adu, MD;  Location: [facility] OR;  Service: General;  Laterality: N/A;   History reviewed. No pertinent family history. Social History  Substance Use Topics  . Smoking status: Current Every Day Smoker -- 0.12 packs/day    Types: Cigarettes  . Smokeless tobacco: Never Used   . Alcohol Use: No   OB History    No data available     Review of Systems  Constitutional: Negative for fever and appetite change.  Gastrointestinal: Positive for nausea, abdominal pain and abdominal distention. Negative for vomiting, diarrhea, constipation and blood in stool.  Genitourinary: Negative for dysuria, urgency, frequency and vaginal discharge.  All other systems reviewed and are negative.  Allergies  Review of patient's allergies indicates no known allergies.  Home Medications   Prior to Admission medications   Medication Sig Start Date End Date Taking? Authorizing Provider  HYDROcodone-acetaminophen (NORCO) 5-325 MG tablet Take 1-2 tablets by mouth every 6 (six) hours as needed for moderate pain. 07/20/15   Barnetta Chapel, PA-C  ranitidine (ZANTAC) 150 MG tablet Take 300 mg by mouth 2 (two) times daily as needed for heartburn.    Historical Provider, MD   BP 109/54 mmHg  Pulse 68  Temp(Src) 98 F (36.7 C) (Oral)  Resp 18  SpO2 99%   Physical Exam  Constitutional: She is oriented to person, place, and time. She appears well-developed and well-nourished. No distress.  HENT:  Head: Normocephalic and atraumatic.  Mouth/Throat: Oropharynx is clear and moist.  Eyes: Conjunctivae and EOM are normal.  Neck: Neck supple. No tracheal deviation present.  Cardiovascular: Normal rate, regular rhythm, normal heart sounds and intact distal pulses.   Pulmonary/Chest: Effort normal and breath sounds normal. No respiratory distress. She has no wheezes. She has no rales.  Abdominal: Soft. Bowel sounds are normal. She exhibits no distension. There is tenderness. There is  no rebound and no guarding.  Mild diffuse TTP of the lower abdomen  Musculoskeletal: Normal range of motion.  Neurological: She is alert and oriented to person, place, and time.  Skin: Skin is warm and dry.  Psychiatric: She has a normal mood and affect. Her behavior is normal.  Nursing note and vitals  reviewed.   ED Course  Procedures (including critical care time)  DIAGNOSTIC STUDIES: Oxygen Saturation is 99% on RA, normal by my interpretation.    COORDINATION OF CARE: 4:49 PM-Discussed treatment plan which includes Bentyl with pt at bedside and pt agreed to plan.   6:17 PM - Upon re-evaluation, pt states her pain has improved. Will discharge pt.   Labs Review Labs Reviewed  COMPREHENSIVE METABOLIC PANEL - Abnormal; Notable for the following:    Glucose, Bld 116 (*)    ALT 12 (*)    All other components within normal limits  CBC - Abnormal; Notable for the following:    Hemoglobin 11.9 (*)    HCT 35.4 (*)    All other components within normal limits  LIPASE, BLOOD  URINALYSIS, ROUTINE W REFLEX MICROSCOPIC (NOT [facility])  I-STAT BETA HCG BLOOD, ED ([facility], [facility], [facility] ONLY)    Imaging Review No results found. I have personally reviewed and evaluated these lab results as part of my medical decision-making.   EKG Interpretation None      MDM   Final diagnoses:  None  Patient presents today with intermittent lower abdominal pain that she describes as sharp cramping.  She states that the pain typically lasts for 5 minutes and then resolves.  No CVA tenderness on exam and pain is bilateral, therefore, doubt nephrolithiasis.  Also feel that ovarian pathology is unlikely due to the fact that the pain is bilateral.  Feel that the pain is most consistent with bowel spasms.  Pain improved in the ED after given Bentyl.  No vomiting in the ED. Labs unremarkable.  Pregnancy test is negative.  Feel that the patient is stable for discharge.  Return precautions given.    I personally performed the services described in this documentation, which was scribed in my presence. The recorded information has been reviewed and is accurate.      Santiago GladHeather Jearline Hirschhorn, PA-C 01/02/16 2158  Bethann BerkshireJoseph Zammit, MD 01/04/16 [phone]

## 2016-01-01 NOTE — ED Notes (Signed)
Pt states has had abd pain with some bloating started today-- last BM this am, normal. Last period was last month, only lasted 3 days-- has had tubal ligation

## 2016-01-01 NOTE — ED Notes (Signed)
Pt sts lower abd pain with some bloating x 2 days

## 2016-02-19 ENCOUNTER — Emergency Department (HOSPITAL_COMMUNITY)
Admission: EM | Admit: 2016-02-19 | Discharge: 2016-02-19 | Disposition: A | Discharge status: Home / Self Care | Payer: MEDICAID | Attending: Emergency Medicine | Admitting: Emergency Medicine

## 2016-02-19 ENCOUNTER — Encounter (HOSPITAL_COMMUNITY): Payer: Self-pay | Admitting: *Deleted

## 2016-02-19 DIAGNOSIS — Z3202 Encounter for pregnancy test, result negative: Secondary | ICD-10-CM | POA: Insufficient documentation

## 2016-02-19 DIAGNOSIS — N898 Other specified noninflammatory disorders of vagina: Secondary | ICD-10-CM | POA: Insufficient documentation

## 2016-02-19 DIAGNOSIS — F17210 Nicotine dependence, cigarettes, uncomplicated: Secondary | ICD-10-CM | POA: Insufficient documentation

## 2016-02-19 DIAGNOSIS — R350 Frequency of micturition: Secondary | ICD-10-CM | POA: Insufficient documentation

## 2016-02-19 DIAGNOSIS — Z79899 Other long term (current) drug therapy: Secondary | ICD-10-CM | POA: Insufficient documentation

## 2016-02-19 DIAGNOSIS — K219 Gastro-esophageal reflux disease without esophagitis: Secondary | ICD-10-CM | POA: Insufficient documentation

## 2016-02-19 DIAGNOSIS — L739 Follicular disorder, unspecified: Secondary | ICD-10-CM | POA: Insufficient documentation

## 2016-02-19 LAB — WET PREP, GENITAL
Clue Cells Wet Prep HPF POC: NONE SEEN
Sperm: NONE SEEN
TRICH WET PREP: NONE SEEN
YEAST WET PREP: NONE SEEN

## 2016-02-19 LAB — URINE MICROSCOPIC-ADD ON

## 2016-02-19 LAB — URINALYSIS, ROUTINE W REFLEX MICROSCOPIC
BILIRUBIN URINE: NEGATIVE
GLUCOSE, UA: NEGATIVE mg/dL
Ketones, ur: NEGATIVE mg/dL
Leukocytes, UA: NEGATIVE
NITRITE: NEGATIVE
PH: 7 (ref 5.0–8.0)
Protein, ur: NEGATIVE mg/dL
SPECIFIC GRAVITY, URINE: 1.018 (ref 1.005–1.030)

## 2016-02-19 LAB — PREGNANCY, URINE: Preg Test, Ur: NEGATIVE

## 2016-02-19 MED ORDER — MUPIROCIN 2 % EX OINT: 1.0000 "application " | TOPICAL_OINTMENT | Freq: Two times a day (BID) | CUTANEOUS | Status: DC

## 2016-02-19 NOTE — Discharge Instructions (Signed)
Folliculitis °Folliculitis is redness, soreness, and swelling (inflammation) of the hair follicles. This condition can occur anywhere on the body. People with weakened immune systems, diabetes, or obesity have a greater risk of getting folliculitis. °CAUSES °· Bacterial infection. This is the most common cause. °· Fungal infection. °· Viral infection. °· Contact with certain chemicals, especially oils and tars. °Long-term folliculitis can result from bacteria that live in the nostrils. The bacteria may trigger multiple outbreaks of folliculitis over time. °SYMPTOMS °Folliculitis most commonly occurs on the scalp, thighs, legs, back, buttocks, and areas where hair is shaved frequently. An early sign of folliculitis is a small, white or yellow, pus-filled, itchy lesion (pustule). These lesions appear on a red, inflamed follicle. They are usually less than 0.2 inches (5 mm) wide. When there is an infection of the follicle that goes deeper, it becomes a boil or furuncle. A group of closely packed boils creates a larger lesion (carbuncle). Carbuncles tend to occur in hairy, sweaty areas of the body. °DIAGNOSIS  °Your caregiver can usually tell what is wrong by doing a physical exam. A sample may be taken from one of the lesions and tested in a lab. This can help determine what is causing your folliculitis. °TREATMENT  °Treatment may include: °· Applying warm compresses to the affected areas. °· Taking antibiotic medicines orally or applying them to the skin. °· Draining the lesions if they contain a large amount of pus or fluid. °· Laser hair removal for cases of long-lasting folliculitis. This helps to prevent regrowth of the hair. °HOME CARE INSTRUCTIONS °· Apply warm compresses to the affected areas as directed by your caregiver. °· If antibiotics are prescribed, take them as directed. Finish them even if you start to feel better. °· You may take over-the-counter medicines to relieve itching. °· Do not shave irritated  skin. °· Follow up with your caregiver as directed. °SEEK IMMEDIATE MEDICAL CARE IF:  °· You have increasing redness, swelling, or pain in the affected area. °· You have a fever. °MAKE SURE YOU: °· Understand these instructions. °· Will watch your condition. °· Will get help right away if you are not doing well or get worse. °  °This information is not intended to replace advice given to you by your health care provider. Make sure you discuss any questions you have with your health care provider. °  °Document Released: 11/10/2001 Document Revised: 09/22/2014 Document Reviewed: 12/02/2011 °Elsevier Interactive Patient Education ©2016 [employer] ° °

## 2016-02-19 NOTE — ED Provider Notes (Signed)
CSN: [id number]     Arrival date & time 02/19/16  1535 History   First MD Initiated Contact with Patient 02/19/16 1846     Chief Complaint  Patient presents with  . Vaginal Discharge  . Urinary Frequency     (Consider location/radiation/quality/duration/timing/severity/associated sxs/prior Treatment) HPI Comments: Patient presents with vaginal discharge. She reports a one-week history of worsening vaginal discharge. She has some burning on urination. She's also noted a tender rash developed to her vaginal area within last few days. She denies abdominal pain. No nausea or vomiting. No fevers. She is sexually active. No history of prior STDs.  Patient is a 30 y.o. female presenting with vaginal discharge and frequency.  Vaginal Discharge Associated symptoms: no abdominal pain, no fever, no nausea and no vomiting   Urinary Frequency Pertinent negatives include no chest pain, no abdominal pain, no headaches and no shortness of breath.    Past Medical History  Diagnosis Date  . GERD (gastroesophageal reflux disease)    Past Surgical History  Procedure Laterality Date  . Tubal ligation  2010  . Tubal ligation    . Cholecystectomy N/A 07/19/2015    Procedure: LAPAROSCOPIC CHOLECYSTECTOMY WITH INTRAOPERATIVE CHOLANGIOGRAM;  Surgeon: Gaynelle AduEric Wilson, MD;  [facility];  Service: General;  Laterality: N/A;   No family history on file. Social History  Substance Use Topics  . Smoking status: Current Every Day Smoker -- 0.12 packs/day    Types: Cigarettes  . Smokeless tobacco: Never Used  . Alcohol Use: No   OB History    No data available     Review of Systems  Constitutional: Negative for fever, chills, diaphoresis and fatigue.  HENT: Negative for congestion, rhinorrhea and sneezing.   Eyes: Negative.   Respiratory: Negative for cough, chest tightness and shortness of breath.   Cardiovascular: Negative for chest pain and leg swelling.  Gastrointestinal: Negative for nausea,  vomiting, abdominal pain, diarrhea and blood in stool.  Genitourinary: Positive for frequency, vaginal discharge and genital sores. Negative for hematuria, flank pain, vaginal bleeding and difficulty urinating.  Musculoskeletal: Negative for back pain and arthralgias.  Skin: Negative for rash.  Neurological: Negative for dizziness, speech difficulty, weakness, numbness and headaches.      Allergies  Review of patient's allergies indicates no known allergies.  Home Medications   Prior to Admission medications   Medication Sig Start Date End Date Taking? Authorizing Provider  acetaminophen (TYLENOL) 500 MG tablet Take 1,000 mg by mouth every 6 (six) hours as needed for mild pain or moderate pain.   Yes Historical Provider, MD  ibuprofen (ADVIL,MOTRIN) 200 MG tablet Take 800 mg by mouth every 6 (six) hours as needed for mild pain, moderate pain or cramping.   Yes Historical Provider, MD  ranitidine (ZANTAC) 150 MG tablet Take 150-300 mg by mouth See admin instructions. Once to twice daily depending on symptoms   Yes Historical Provider, MD  dicyclomine (BENTYL) 20 MG tablet Take 1 tablet (20 mg total) by mouth 2 (two) times daily. 01/01/16   Heather Laisure, PA-C  HYDROcodone-acetaminophen (NORCO) 5-325 MG tablet Take 1-2 tablets by mouth every 6 (six) hours as needed for moderate pain. 07/20/15   Barnetta ChapelKelly Osborne, PA-C  mupirocin ointment (BACTROBAN) 2 % Place 1 application into the nose 2 (two) times daily. Apply to infected area twice daily for 7 days 02/19/16   Rolan BuccoMelanie Iliya Spivack, MD  ondansetron (ZOFRAN) 4 MG tablet Take 1 tablet (4 mg total) by mouth every 6 (six) hours. 01/01/16  Heather Laisure, PA-C   BP 113/69 mmHg  Pulse 66  Temp(Src) 98.2 F (36.8 C) (Oral)  Resp 18  Wt 123 lb 4.8 oz (55.929 kg)  SpO2 100% Physical Exam  Constitutional: She is oriented to person, place, and time. She appears well-developed and well-nourished.  HENT:  Head: Normocephalic and atraumatic.  Eyes: Pupils  are equal, round, and reactive to light.  Neck: Normal range of motion. Neck supple.  Cardiovascular: Normal rate, regular rhythm and normal heart sounds.   Pulmonary/Chest: Effort normal and breath sounds normal. No respiratory distress. She has no wheezes. She has no rales. She exhibits no tenderness.  Abdominal: Soft. Bowel sounds are normal. There is no tenderness. There is no rebound and no guarding.  Genitourinary:  Moderate white discharge.  No cervical motion tenderness or adnexal tenderness. She has a 2 cm circular area of erythema to her mons pubis.  Some pustules. No vesicles  Musculoskeletal: Normal range of motion. She exhibits no edema.  Lymphadenopathy:    She has no cervical adenopathy.  Neurological: She is alert and oriented to person, place, and time.  Skin: Skin is warm and dry. No rash noted.  Psychiatric: She has a normal mood and affect.    ED Course  Procedures (including critical care time) Labs Review Results for orders placed or performed during the hospital encounter of 02/19/16  Wet prep, genital  Result Value Ref Range   Yeast Wet Prep HPF POC NONE SEEN NONE SEEN   Trich, Wet Prep NONE SEEN NONE SEEN   Clue Cells Wet Prep HPF POC NONE SEEN NONE SEEN   WBC, Wet Prep HPF POC MODERATE (A) NONE SEEN   Sperm NONE SEEN   Urinalysis, Routine w reflex microscopic- may I&O cath if menses  Result Value Ref Range   Color, Urine YELLOW YELLOW   APPearance CLEAR CLEAR   Specific Gravity, Urine 1.018 1.005 - 1.030   pH 7.0 5.0 - 8.0   Glucose, UA NEGATIVE NEGATIVE mg/dL   Hgb urine dipstick TRACE (A) NEGATIVE   Bilirubin Urine NEGATIVE NEGATIVE   Ketones, ur NEGATIVE NEGATIVE mg/dL   Protein, ur NEGATIVE NEGATIVE mg/dL   Nitrite NEGATIVE NEGATIVE   Leukocytes, UA NEGATIVE NEGATIVE  Urine microscopic-add on  Result Value Ref Range   Squamous Epithelial / LPF 0-5 (A) NONE SEEN   WBC, UA 0-5 0 - 5 WBC/hpf   RBC / HPF 0-5 0 - 5 RBC/hpf   Bacteria, UA FEW (A)  NONE SEEN   Urine-Other MUCOUS PRESENT   Pregnancy, urine  Result Value Ref Range   Preg Test, Ur NEGATIVE NEGATIVE   No results found.    Imaging Review No results found. I have personally reviewed and evaluated these images and lab results as part of my medical decision-making.   EKG Interpretation None      MDM   Final diagnoses:  Vaginal discharge  Folliculitis    Patient presents with vaginal discharge. Her wet prep is negative. GC and Chlamydia are pending. I offered her prophylactic treatment with Rocephin and Zithromax but she's refusing until her swabs come back. She has a small area of a rash to her mons pubis. I feel that this is most consistent with folliculitis. She has shaved her pubic hair in this area. There is no blistering that would be more consistent with herpes infection. However I did test her for HSV. This is pending. She was discharged home in good condition. Her pelvic exam was otherwise unremarkable.  There is no evidence of deep. She was given a prescription for Bactroban ointment. She was advised not to shave until her symptoms have cleared up. She was encouraged to follow-up with the women's outpatient center if her symptoms are not improving or return here as needed for any worsening symptoms.    Rolan Bucco, MD 02/19/16 2132

## 2016-02-19 NOTE — ED Notes (Signed)
Pt is here with clear vaginal discharge, burning with urination and rash

## 2016-02-20 LAB — RPR: RPR: NONREACTIVE

## 2016-02-20 LAB — HIV ANTIBODY (ROUTINE TESTING W REFLEX): HIV SCREEN 4TH GENERATION: NONREACTIVE

## 2016-02-20 LAB — GC/CHLAMYDIA PROBE AMP (~~LOC~~) NOT AT ARMC
CHLAMYDIA, DNA PROBE: NEGATIVE
Neisseria Gonorrhea: NEGATIVE

## 2016-02-21 LAB — HERPES SIMPLEX VIRUS(HSV) DNA BY PCR
HSV 1 DNA: NEGATIVE
HSV 2 DNA: NEGATIVE

## 2018-07-14 ENCOUNTER — Other Ambulatory Visit: Payer: Self-pay | Admitting: Physician Assistant

## 2018-07-14 ENCOUNTER — Ambulatory Visit
Admission: RE | Admit: 2018-07-14 | Discharge: 2018-07-14 | Disposition: A | Discharge status: Home / Self Care | Payer: BLUE CROSS/BLUE SHIELD | Source: Ambulatory Visit | Attending: Physician Assistant | Admitting: Physician Assistant

## 2018-07-14 DIAGNOSIS — R202 Paresthesia of skin: Secondary | ICD-10-CM

## 2018-07-14 DIAGNOSIS — R531 Weakness: Secondary | ICD-10-CM

## 2018-07-14 DIAGNOSIS — R200 Anesthesia of skin: Secondary | ICD-10-CM

## 2018-07-14 MED ORDER — GADOBENATE DIMEGLUMINE 529 MG/ML IV SOLN
10.0000 mL | Freq: Once | INTRAVENOUS | Status: AC | PRN
  Administered 2018-07-14: 10 mL via INTRAVENOUS

## 2018-07-22 ENCOUNTER — Ambulatory Visit: Payer: BLUE CROSS/BLUE SHIELD | Admitting: Neurology

## 2018-07-22 ENCOUNTER — Encounter: Payer: Self-pay | Admitting: Neurology

## 2018-07-22 VITALS — BP 110/65 | HR 65 | Ht 64.0 in | Wt 128.0 lb

## 2018-07-22 DIAGNOSIS — R202 Paresthesia of skin: Secondary | ICD-10-CM | POA: Diagnosis not present

## 2018-07-22 DIAGNOSIS — R29898 Other symptoms and signs involving the musculoskeletal system: Secondary | ICD-10-CM | POA: Diagnosis not present

## 2018-07-22 DIAGNOSIS — R200 Anesthesia of skin: Secondary | ICD-10-CM

## 2018-07-22 DIAGNOSIS — M6281 Muscle weakness (generalized): Secondary | ICD-10-CM

## 2018-07-22 NOTE — Progress Notes (Signed)
Subjective:    Patient ID: Molly Beard is a 32 y.o. female.  HPI     Huston Foley, MD, PhD [facility] [address], [address] [address] [place], [place] [address]  Dear Lavonna Rua,   I saw your patient, Molly Beard, upon your kind request, in my neurologic clinic today for initial consultation of her right-sided hemibody numbness and tingling. The patient is unaccompanied today. As you know, Molly Beard is a 32 year old right-handed woman with an underlying medical history of degenerative disc disease, history of gallstones and pancreatitis, who reports sudden onset of bilateral numbness and tingling on 07/14/2018. She started with symptoms in her hands in the right side first and then the left side. She woke up with some tingling in the right hand which was not completely unusual for her as she sometimes sleeps on one arm and wakes up with tingling. Nevertheless, her tingling and numbness progressed to affect the right upper extremity and then crossed over to the left. She also started having symptoms in her throat and felt it was hard for her to swallow at one point. She had some weakness in both arms but also right leg. She did not fall. She did not go to the emergency room. I reviewed your office note from 07/14/2018. She also had recent blood work through your office which I reviewed from 07/14/2018. CMP was unremarkable, TSH normal. She had a brain MRI with and without contrast on 07/14/2018 and I reviewed the results: Impression: IMPRESSION: Negative MRI head with contrast.  Of note, she went to the emergency room in 2016 with similar symptoms. She had a negative MRI brain at the time. 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 Of note, her symptoms gradually improved over days, maybe by day 4 she no longer had weakness and has some residual numbness in the right hand. She does not have any visual symptoms or history of migraines, had no headache with any of these  symptoms. She has no family history of migraines. She tries to hydrate well. She drinks caffeine in the form of tea, about 3 servings per day. She does not sleep very well lately, no obvious trigger. Smokes 1 ppd.  Her Past Medical History Is Significant For: Past Medical History:  Diagnosis Date  . DDD (degenerative disc disease), lumbar   . Gallstone pancreatitis   . GERD (gastroesophageal reflux disease)     Her Past Surgical History Is Significant For: Past Surgical History:  Procedure Laterality Date  . CHOLECYSTECTOMY N/A 07/19/2015   Procedure: LAPAROSCOPIC CHOLECYSTECTOMY WITH INTRAOPERATIVE CHOLANGIOGRAM;  Surgeon: Gaynelle Adu, MD;  Location: [facility] OR;  Service: General;  Laterality: N/A;  . TUBAL LIGATION  2010  . TUBAL LIGATION      Her Family History Is Significant For: No family history on file.  Her Social History Is Significant For: Social History   Socioeconomic History  . Marital status: Married    Spouse name: Not on file  . Number of children: Not on file  . Years of education: Not on file  . Highest education level: Not on file  Occupational History  . Not on file  Social Needs  . Financial resource strain: Not on file  . Food insecurity:    Worry: Not on file    Inability: Not on file  . Transportation needs:    Medical: Not on file    Non-medical: Not on file  Tobacco Use  . Smoking status: Current Every Day  Smoker    Packs/day: 0.12    Types: Cigarettes  . Smokeless tobacco: Never Used  Substance and Sexual Activity  . Alcohol use: No  . Drug use: No  . Sexual activity: Not Currently  Lifestyle  . Physical activity:    Days per week: Not on file    Minutes per session: Not on file  . Stress: Not on file  Relationships  . Social connections:    Talks on phone: Not on file    Gets together: Not on file    Attends religious service: Not on file    Active member of club or organization: Not on file    Attends meetings of clubs or  organizations: Not on file    Relationship status: Not on file  Other Topics Concern  . Not on file  Social History Narrative  . Not on file    Her Allergies Are:  No Known Allergies:   Her Current Medications Are:  Outpatient Encounter Medications as of 07/22/2018  Medication Sig  . acetaminophen (TYLENOL) 500 MG tablet Take 1,000 mg by mouth every 6 (six) hours as needed for mild pain or moderate pain.  Marland Kitchen ibuprofen (ADVIL,MOTRIN) 200 MG tablet Take 800 mg by mouth every 6 (six) hours as needed for mild pain, moderate pain or cramping.  . mupirocin ointment (BACTROBAN) 2 % Place 1 application into the nose 2 (two) times daily. Apply to infected area twice daily for 7 days  . ranitidine (ZANTAC) 150 MG tablet Take 150-300 mg by mouth See admin instructions. Once to twice daily depending on symptoms  . [DISCONTINUED] dicyclomine (BENTYL) 20 MG tablet Take 1 tablet (20 mg total) by mouth 2 (two) times daily.  . [DISCONTINUED] HYDROcodone-acetaminophen (NORCO) 5-325 MG tablet Take 1-2 tablets by mouth every 6 (six) hours as needed for moderate pain.  . [DISCONTINUED] ondansetron (ZOFRAN) 4 MG tablet Take 1 tablet (4 mg total) by mouth every 6 (six) hours.   No facility-administered encounter medications on file as of 07/22/2018.   : Review of Systems:  Out of a complete 14 point review of systems, all are reviewed and negative with the exception of these symptoms as listed below:  Review of Systems  Neurological:       Pt presents today to discuss her numbness and tingling. Pt has had a couple episodes of numbness that gradually gets better. Pt denies associated pain in her head. Pt denies a family history of migraine headaches.    Objective:  Neurological Exam  Physical Exam Physical Examination:   Vitals:   07/22/18 1003  BP: 110/65  Pulse: 65    General Examination: The patient is a very pleasant 32 y.o. female in no acute distress. She appears well-developed and  well-nourished and well groomed.   HEENT: Normocephalic, atraumatic, pupils are equal, round and reactive to light and accommodation. Funduscopic exam is normal with sharp disc margins noted. Extraocular tracking is good without limitation to gaze excursion or nystagmus noted. Normal smooth pursuit is noted. Hearing is grossly intact. Face is symmetric with normal facial animation and normal facial sensation, slight decrease in PP R midface. Speech is clear with no dysarthria noted. There is no hypophonia. There is no lip, neck/head, jaw or voice tremor. Neck is supple with full range of passive and active motion. There are no carotid bruits on auscultation. Oropharynx exam reveals: mild mouth dryness, adequate dental hygiene. Tongue protrudes centrally and palate elevates symmetrically (tongue piercing).   Chest: Clear to  auscultation without wheezing, rhonchi or crackles noted.  Heart: S1+S2+0, regular and normal without murmurs, rubs or gallops noted.   Abdomen: Soft, non-tender and non-distended with normal bowel sounds appreciated on auscultation.  Extremities: There is no pitting edema in the distal lower extremities bilaterally. Pedal pulses are intact.  Skin: Warm and dry without trophic changes noted.  Musculoskeletal: exam reveals no obvious joint deformities, tenderness or joint swelling or erythema.   Neurologically:  Mental status: The patient is awake, alert and oriented in all 4 spheres. Her immediate and remote memory, attention, language skills and fund of knowledge are appropriate. There is no evidence of aphasia, agnosia, apraxia or anomia. Speech is clear with normal prosody and enunciation. Thought process is linear. Mood is constricted and affect is blunted.  Cranial nerves II - XII are as described above under HEENT exam. In addition: shoulder shrug is normal with equal shoulder height noted. Motor exam: Normal bulk, strength and tone is noted. There is no drift, tremor or  rebound. Romberg is negative. Reflexes are 2+ throughout. Babinski: Toes are flexor bilaterally. Fine motor skills and coordination: intact with normal finger taps, normal hand movements, normal rapid alternating patting, normal foot taps and normal foot agility.  Cerebellar testing: No dysmetria or intention tremor on finger to nose testing. Heel to shin is unremarkable bilaterally. There is no truncal or gait ataxia.  Sensory exam: intact to light touch, pinprick, vibration, temperature sense in the upper and lower extremities, with the exception of slight decrease in pinprick sensation in the right forearm and right shin area. Gait, station and balance: She stands easily. No veering to one side is noted. No leaning to one side is noted. Posture is age-appropriate and stance is narrow based. Gait shows normal stride length and normal pace. No problems turning are noted. Tandem walk is unremarkable.   Assessment and Plan:   In summary, SAMIYAH STUPKA is a very pleasant 32 y.o.-year old female with an underlying medical history of degenerative disc disease, history of gallstones and pancreatitis, who presents for evaluation of her bilateral numbness and tingling as well as weakness, which started on 07/14/2018 suddenly. She has had most complete resolution of her symptoms with the exception of residual decrease in pinprick sensation as reported in the right hemibody including midface, right forearm and right distal leg. Her exam is otherwise nonfocal and reassuring, MRI brain recently on 07/14/2018 was negative and also negative as reported in 2016. 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 Her symptoms are not classic for hemiplegic migraine in particular, her symptoms were not in keeping with a typical migraine aura and she did not have any associated headache before, during or after symptom onset. I'm not quite sure how to explain her symptoms. Reassuringly, her exam is near normal, she  feels much improved and imaging testing was negative. I would like to proceed with an EEG, we can call her with her test results. So long as the EEG is also normal I can see her back as needed. I also reviewed her recent blood work. She is advised to try to maintain healthy lifestyle including good hydration and proper rest at night. She is advised to call 911 or go to the emergency room should she have any sudden onset of one-sided weakness or numbness or swallowing difficulty or shortness of breath or slurring of speech. I answered all her questions today and the patient was in agreement.  Thank you very much for allowing  me to participate in the care of this nice patient. If I can be of any further assistance to you please do not hesitate to call me at [phone].  Sincerely,   Melis Trochez, MD, PhD  

## 2018-07-22 NOTE — Patient Instructions (Addendum)
I am not sure how to explain your symptoms of numbness, tingling and weakness.  Thankfully, you have a fairly normal neurological exam and you feel better.  You had a normal brain MRI recently.  We will do an EEG (brainwave test), which we will schedule. We will call you with the results. Should you have any sudden onset of one-sided weakness, numbness, tingling, shortness of breath, chest pain, one-sided headache or severe headache, changes in mental state, or swallowing difficulty, please proceed to the ER or call 911 immediately.

## 2018-08-18 ENCOUNTER — Ambulatory Visit: Payer: BLUE CROSS/BLUE SHIELD | Admitting: Neurology

## 2018-08-18 ENCOUNTER — Encounter: Payer: Self-pay | Admitting: Neurology

## 2018-08-18 DIAGNOSIS — M6281 Muscle weakness (generalized): Secondary | ICD-10-CM

## 2018-08-18 DIAGNOSIS — R258 Other abnormal involuntary movements: Secondary | ICD-10-CM

## 2018-08-18 DIAGNOSIS — R29898 Other symptoms and signs involving the musculoskeletal system: Secondary | ICD-10-CM

## 2018-08-18 DIAGNOSIS — R200 Anesthesia of skin: Secondary | ICD-10-CM

## 2018-08-18 DIAGNOSIS — R202 Paresthesia of skin: Principal | ICD-10-CM

## 2018-08-20 NOTE — Procedures (Signed)
   HISTORY: 32 year old female, presenting with right side numbness tingling, starting from right arm, spreading across to the left,  TECHNIQUE:  This is a routine 16 channel EEG recording with one channel devoted to a limited EKG recording.  It was performed during wakefulness, drowsiness and asleep.  Hyperventilation and photic stimulation were performed as activating procedures.  There are minimum muscle and movement artifact noted.  Upon maximum arousal, posterior dominant waking rhythm consistent of rhythmic alpha range activity, with frequency of 10 hz. Activities are symmetric over the bilateral posterior derivations and attenuated with eye opening.  Hyperventilation produced mild/moderate buildup with higher amplitude and the slower activities noted.  Photic stimulation did not alter the tracing.  During EEG recording, patient developed drowsiness and entered sleep, sleep EEG demonstrated architecture, there were frontal centrally dominant vertex waves and symmetric sleep spindles noted.  During EEG recording, there was no epileptiform discharge noted.  EKG demonstrate sinus rhythm, with heart rate of 66 bpm  CONCLUSION: This is a  normal awake and sleep EEG.  There is no electrodiagnostic evidence of epileptiform discharge.  Levert FeinsteinYijun Quilla Freeze, M.D. Ph.D.  [facility] [address] [place], [place] [address] Phone: [phone] Fax:      [phone]

## 2018-08-23 ENCOUNTER — Telehealth: Payer: Self-pay

## 2018-08-23 NOTE — Telephone Encounter (Signed)
-----   Message from Huston FoleySaima Athar, MD sent at 08/23/2018  9:49 AM EST ----- Please call and advise the patient that the EEG or brain wave test we performed was reported as normal in the awake and asleep states. We checked for abnormal electrical discharges in the brain waves and the report suggested normal findings. No further action is required on this test at this time.  I can see her back prn.  Huston FoleySaima Athar, MD, PhD

## 2018-08-23 NOTE — Progress Notes (Signed)
Please call and advise the patient that the EEG or brain wave test we performed was reported as normal in the awake and asleep states. We checked for abnormal electrical discharges in the brain waves and the report suggested normal findings. No further action is required on this test at this time.  I can see her back prn.  Huston FoleySaima Thomasine Klutts, MD, PhD

## 2018-08-23 NOTE — Telephone Encounter (Signed)
I called pt to discuss her EEG results. No answer, left a detailed message per DPR, advising her of the normal results and that she can follow up PRN with our office. I asked pt to call us back with any questions or concerns.

## 2019-02-04 ENCOUNTER — Emergency Department (HOSPITAL_COMMUNITY): Payer: Self-pay

## 2019-02-04 ENCOUNTER — Encounter (HOSPITAL_COMMUNITY): Payer: Self-pay | Admitting: Emergency Medicine

## 2019-02-04 ENCOUNTER — Emergency Department (HOSPITAL_COMMUNITY)
Admission: EM | Admit: 2019-02-04 | Discharge: 2019-02-04 | Disposition: A | Discharge status: Home / Self Care | Payer: Self-pay | Attending: Emergency Medicine | Admitting: Emergency Medicine

## 2019-02-04 ENCOUNTER — Other Ambulatory Visit: Payer: Self-pay

## 2019-02-04 DIAGNOSIS — F17210 Nicotine dependence, cigarettes, uncomplicated: Secondary | ICD-10-CM | POA: Insufficient documentation

## 2019-02-04 DIAGNOSIS — N83209 Unspecified ovarian cyst, unspecified side: Secondary | ICD-10-CM

## 2019-02-04 DIAGNOSIS — N83201 Unspecified ovarian cyst, right side: Secondary | ICD-10-CM | POA: Insufficient documentation

## 2019-02-04 DIAGNOSIS — Z79899 Other long term (current) drug therapy: Secondary | ICD-10-CM | POA: Insufficient documentation

## 2019-02-04 LAB — URINALYSIS, ROUTINE W REFLEX MICROSCOPIC
Bilirubin Urine: NEGATIVE
Glucose, UA: NEGATIVE mg/dL
Ketones, ur: NEGATIVE mg/dL
Leukocytes,Ua: NEGATIVE
Nitrite: NEGATIVE
Protein, ur: NEGATIVE mg/dL
Specific Gravity, Urine: 1.003 — ABNORMAL LOW (ref 1.005–1.030)
pH: 7 (ref 5.0–8.0)

## 2019-02-04 LAB — CBC WITH DIFFERENTIAL/PLATELET
Abs Immature Granulocytes: 0.02 10*3/uL (ref 0.00–0.07)
Basophils Absolute: 0 10*3/uL (ref 0.0–0.1)
Basophils Relative: 1 %
Eosinophils Absolute: 0.2 10*3/uL (ref 0.0–0.5)
Eosinophils Relative: 3 %
HCT: 39.9 % (ref 36.0–46.0)
Hemoglobin: 13.4 g/dL (ref 12.0–15.0)
Immature Granulocytes: 0 %
Lymphocytes Relative: 27 %
Lymphs Abs: 1.7 10*3/uL (ref 0.7–4.0)
MCH: 30 pg (ref 26.0–34.0)
MCHC: 33.6 g/dL (ref 30.0–36.0)
MCV: 89.3 fL (ref 80.0–100.0)
Monocytes Absolute: 0.5 10*3/uL (ref 0.1–1.0)
Monocytes Relative: 9 %
Neutro Abs: 3.8 10*3/uL (ref 1.7–7.7)
Neutrophils Relative %: 60 %
Platelets: 268 10*3/uL (ref 150–400)
RBC: 4.47 MIL/uL (ref 3.87–5.11)
RDW: 12.7 % (ref 11.5–15.5)
WBC: 6.3 10*3/uL (ref 4.0–10.5)
nRBC: 0 % (ref 0.0–0.2)

## 2019-02-04 LAB — COMPREHENSIVE METABOLIC PANEL
ALT: 17 U/L (ref 0–44)
AST: 19 U/L (ref 15–41)
Albumin: 4.3 g/dL (ref 3.5–5.0)
Alkaline Phosphatase: 74 U/L (ref 38–126)
Anion gap: 10 (ref 5–15)
BUN: 9 mg/dL (ref 6–20)
CO2: 20 mmol/L — ABNORMAL LOW (ref 22–32)
Calcium: 9.4 mg/dL (ref 8.9–10.3)
Chloride: 107 mmol/L (ref 98–111)
Creatinine, Ser: 0.66 mg/dL (ref 0.44–1.00)
GFR calc Af Amer: >60 mL/min (ref >60)
GFR calc non Af Amer: >60 mL/min (ref >60)
Glucose, Bld: 95 mg/dL (ref 70–99)
Potassium: 3.9 mmol/L (ref 3.5–5.1)
Sodium: 137 mmol/L (ref 135–145)
Total Bilirubin: 0.6 mg/dL (ref 0.3–1.2)
Total Protein: 7.1 g/dL (ref 6.5–8.1)

## 2019-02-04 LAB — I-STAT BETA HCG BLOOD, ED (MC, WL, AP ONLY): I-stat hCG, quantitative: <5 m[IU]/mL (ref <5)

## 2019-02-04 MED ORDER — ONDANSETRON HCL 4 MG/2ML IJ SOLN
4.0000 mg | Freq: Once | INTRAMUSCULAR | Status: AC
  Administered 2019-02-04: 4 mg via INTRAVENOUS
  Filled 2019-02-04: qty 2

## 2019-02-04 MED ORDER — MORPHINE SULFATE (PF) 4 MG/ML IV SOLN
4.0000 mg | Freq: Once | INTRAVENOUS | Status: AC
  Administered 2019-02-04: 4 mg via INTRAVENOUS
  Filled 2019-02-04: qty 1

## 2019-02-04 MED ORDER — KETOROLAC TROMETHAMINE 15 MG/ML IJ SOLN
15.0000 mg | Freq: Once | INTRAMUSCULAR | Status: AC
  Administered 2019-02-04: 15 mg via INTRAVENOUS
  Filled 2019-02-04: qty 1

## 2019-02-04 MED ORDER — HYDROCODONE-ACETAMINOPHEN 5-325 MG PO TABS: 2.0000 | ORAL_TABLET | ORAL | 0 refills | Status: DC | PRN

## 2019-02-04 MED ORDER — HYDROMORPHONE HCL 1 MG/ML IJ SOLN
1.0000 mg | Freq: Once | INTRAMUSCULAR | Status: AC
  Administered 2019-02-04: 1 mg via INTRAVENOUS
  Filled 2019-02-04: qty 1

## 2019-02-04 NOTE — Discharge Instructions (Signed)
Your imaging today showed that your pain is most likely caused by a ruptured ovarian cyst. The treatment for this is pain control Please take Ibuprofen 600mg  every 6-8 hours for mild-moderate pain and use a heating pad Take Norco for severe pain as needed every 4 hours Please follow up with OBGYN Return if you are worsening

## 2019-02-04 NOTE — ED Triage Notes (Signed)
Pt in with c/o RLQ pain, sharp in nature. Began at 0630 this am, having n/v. Pt has appendix but not gallbladder

## 2019-02-04 NOTE — ED Notes (Signed)
Pt stated she is keeping her family updated and does not need [place] to call anyone.

## 2019-02-04 NOTE — ED Notes (Signed)
Pt verbalizes discharge instructions. IV removed bleeding controlled.

## 2019-02-04 NOTE — ED Notes (Signed)
Pt transported to [facility]

## 2019-02-04 NOTE — ED Provider Notes (Signed)
[facility]   CSN: [id number] Arrival date & time: 02/04/19  [id number]    History   Chief Complaint Chief Complaint  Patient presents with   Abdominal Pain   RLQ pain    HPI Molly Beard is a 33 y.o. female who presents with R flank pain. PMH significant for lumbar DDD, gallstone pancreatitis. Past surgical hx significant for tubal ligation and cholecystectomy. She reports an acute onset of R flank pain that radiates to her back, to the RLQ, and to her hip and thigh that started acutely at 6AM this morning. It is constant and sharp. Nothing makes it better. Movement makes it worse. She tried Ibuprofen without relief. She reports associated N/V. It makes her flank hurt more when she tries to urinate. She denies fever, chills, chest pain, SOB, diarrhea/constipation, dysuria, hematuria, vaginal discharge. She denies hx of kidney stone.     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      Home Medications    Prior to Admission medications   Medication Sig Start Date End Date Taking? Authorizing Provider  acetaminophen (TYLENOL) 500 MG tablet Take 1,000 mg by mouth every 6 (six) hours as needed for mild pain or moderate pain.    [provider]   ibuprofen (ADVIL,MOTRIN) 200 MG tablet Take 800 mg by mouth every 6 (six) hours as needed for mild pain, moderate pain or cramping.    [provider]  mupirocin ointment (BACTROBAN) 2 % Place 1 application into the nose 2 (two) times daily. Apply to infected area twice daily for 7 days 02/19/16   Rolan BuccoBelfi, Melanie, MD  ranitidine (ZANTAC) 150 MG tablet Take 150-300 mg by mouth See admin instructions. Once to twice daily depending on symptoms    [provider]    Family History No family history on file.  Social History Social History   Tobacco Use   Smoking status: Current Every Day Smoker    Packs/day: 0.12    Types: Cigarettes   Smokeless tobacco: Never Used  Substance Use Topics   Alcohol use: No   Drug use: No     Allergies   Patient has no known allergies.   Review of Systems Review of Systems  Constitutional: Negative for chills and fever.  Respiratory: Negative for shortness of breath.   Cardiovascular: Negative for chest pain.  Gastrointestinal: Positive for abdominal pain, nausea and vomiting. Negative for diarrhea.  Genitourinary: Positive for flank pain. Negative for difficulty urinating, dysuria, hematuria, vaginal bleeding and vaginal discharge.  All other systems reviewed and are negative.    Physical Exam Updated Vital Signs BP 127/73 (BP Location: Right Arm)    Pulse 88    Temp 98.2 F (36.8 C) (Oral)    Resp Marland Kitchen(!)  22    Ht 5\' 4"  (1.626 m)    Wt 74.8 kg    LMP 01/17/2019    SpO2 100%    BMI 28.32 kg/m   Physical Exam Vitals signs and nursing note reviewed.  Constitutional:      General: She is in acute distress (in pain).     Appearance: She is well-developed. She is not ill-appearing.     Comments: Cooperative, distressed  HENT:     Head: Normocephalic and atraumatic.  Eyes:     General: No scleral icterus.       Right eye: No discharge.        Left eye: No discharge.     Conjunctiva/sclera: Conjunctivae normal.     Pupils:  Pupils are equal, round, and reactive to light.  Neck:     Musculoskeletal: Normal range of motion.  Cardiovascular:     Rate and Rhythm: Normal rate and regular rhythm.  Pulmonary:     Effort: Pulmonary effort is normal. No respiratory distress.     Breath sounds: Normal breath sounds.  Abdominal:     General: There is no distension.     Palpations: Abdomen is soft.     Tenderness: There is abdominal tenderness (tender over lower lumbar spine, R CVA tenderness, RUQ/RLQ). There is right CVA tenderness and guarding.  Musculoskeletal:     Comments: Tender with palpation of the R hip and thigh. Pain with ROM  Skin:    General: Skin is warm and dry.  Neurological:     Mental Status: She is alert and oriented to person, place, and time.  Psychiatric:        Behavior: Behavior normal.      ED Treatments / Results  Labs (all labs ordered are listed, but only abnormal results are displayed) Labs Reviewed  COMPREHENSIVE METABOLIC PANEL - Abnormal; Notable for the following components:      Result Value   CO2 20 (*)    All other components within normal limits  URINALYSIS, ROUTINE W REFLEX MICROSCOPIC - Abnormal; Notable for the following components:   Color, Urine COLORLESS (*)    Specific Gravity, Urine 1.003 (*)    Hgb urine dipstick SMALL (*)    Bacteria, UA RARE (*)    All other components within normal limits  CBC WITH DIFFERENTIAL/PLATELET  I-STAT BETA HCG BLOOD, ED ([facility], [facility], [facility] ONLY)    EKG None  Radiology US Transvaginal Non-ob  Result Date: 02/04/2019 CLINICAL DATA:  Acute right lower quadrant abdominal pain. EXAM: TRANSABDOMINAL AND TRANSVAGINAL ULTRASOUND OF PELVIS DOPPLER ULTRASOUND OF OVARIES TECHNIQUE: Both transabdominal and transvaginal ultrasound examinations of the pelvis were performed. Transabdominal technique was performed for global imaging of the pelvis including uterus, ovaries, adnexal regions, and pelvic cul-de-sac. It was necessary to proceed with  endovaginal exam following the transabdominal exam to visualize the endometrium and ovaries. Color and duplex Doppler ultrasound was utilized to evaluate blood flow to the ovaries. COMPARISON:  None. FINDINGS: Uterus Measurements: 8.4 x 5.7 x 4.0 cm = volume: 98 mL. No fibroids or other mass visualized. Endometrium Thickness: 7 mm which is within normal limits. No focal abnormality visualized. Right ovary Measurements: 4.5 x 3.7 x 3.6 cm = volume: 32 mL. 2.9 cm complex cyst is noted most consistent with hemorrhagic cyst. Left ovary Measurements: 2.9 x 2.4 x 2.3 cm = volume: 8 mL. Several small follicular cysts are noted. Pulsed Doppler evaluation of both ovaries demonstrates normal low-resistance arterial and venous waveforms. Other findings  Mild amount of free fluid is noted. IMPRESSION: 2.9 cm complex right ovarian cyst is noted concerning for hemorrhagic cyst. Short-interval follow up ultrasound in 6-12 weeks is recommended, preferably during the week following the patient's normal menses. Mild amount of free fluid is noted which may suggest ruptured ovarian cyst. Electronically Signed   By: Lupita Raider M.D.   On: 02/04/2019 12:49   US Pelvis Complete  Result Date: 02/04/2019 CLINICAL DATA:  Acute right lower quadrant abdominal pain. EXAM: TRANSABDOMINAL AND TRANSVAGINAL ULTRASOUND OF PELVIS DOPPLER ULTRASOUND OF OVARIES TECHNIQUE: Both transabdominal and transvaginal ultrasound examinations of the pelvis were performed. Transabdominal technique was performed for global imaging of the pelvis including uterus, ovaries, adnexal regions, and pelvic cul-de-sac. It was necessary to proceed with endovaginal exam following the transabdominal exam to visualize the endometrium and ovaries. Color and duplex Doppler ultrasound was utilized to evaluate blood flow to the ovaries. COMPARISON:  None. FINDINGS: Uterus Measurements: 8.4 x 5.7 x 4.0 cm = volume: 98 mL. No fibroids or other mass visualized. Endometrium  Thickness: 7 mm which is within normal limits. No focal abnormality visualized. Right ovary Measurements: 4.5 x 3.7 x 3.6 cm = volume: 32 mL. 2.9 cm complex cyst is noted most consistent with hemorrhagic cyst. Left ovary Measurements: 2.9 x 2.4 x 2.3 cm = volume: 8 mL. Several small follicular cysts are noted. Pulsed Doppler evaluation of both ovaries demonstrates normal low-resistance arterial and venous waveforms. Other findings Mild amount of free fluid is noted. IMPRESSION: 2.9 cm complex right ovarian cyst is noted concerning for hemorrhagic cyst. Short-interval follow up ultrasound in 6-12 weeks is recommended, preferably during the week following the patient's normal menses. Mild amount of free fluid is noted which may suggest ruptured ovarian cyst. Electronically Signed   By: Lupita Raider M.D.   On: 02/04/2019 12:49   [place] Art/ven Flow Abd Pelv Doppler  Result Date: 02/04/2019 CLINICAL DATA:  Acute right lower quadrant abdominal pain. EXAM: TRANSABDOMINAL AND TRANSVAGINAL ULTRASOUND OF PELVIS DOPPLER ULTRASOUND OF OVARIES TECHNIQUE: Both transabdominal and transvaginal ultrasound examinations of the pelvis were performed. Transabdominal technique was performed for global imaging of the pelvis including uterus, ovaries, adnexal regions, and pelvic cul-de-sac. It was necessary to proceed with endovaginal exam following the transabdominal exam to visualize the endometrium and ovaries. Color and duplex Doppler ultrasound was utilized to evaluate blood flow to the ovaries. COMPARISON:  None. FINDINGS: Uterus Measurements: 8.4 x 5.7 x 4.0 cm = volume: 98 mL. No fibroids or other mass visualized. Endometrium Thickness: 7 mm which is within normal limits. No focal abnormality visualized. Right ovary Measurements: 4.5 x 3.7 x 3.6 cm = volume: 32 mL. 2.9 cm complex cyst is noted most consistent with hemorrhagic cyst. Left ovary Measurements: 2.9 x 2.4 x 2.3 cm = volume: 8 mL. Several small follicular cysts are  noted. Pulsed Doppler evaluation of both ovaries demonstrates normal low-resistance arterial and venous waveforms. Other findings Mild amount of free fluid is noted. IMPRESSION: 2.9 cm complex right ovarian cyst is noted concerning for hemorrhagic cyst. Short-interval follow up ultrasound in 6-12 weeks is recommended, preferably during the week following the patient's normal menses. Mild amount of free fluid is noted which may suggest ruptured ovarian cyst. Electronically Signed   By: Lupita Raider M.D.   On: 02/04/2019 12:49   Ct Renal Stone Study  Result Date: 02/04/2019 CLINICAL DATA:  Right flank/lower quadrant pain. EXAM: CT ABDOMEN AND PELVIS WITHOUT CONTRAST TECHNIQUE: Multidetector CT imaging of  the abdomen and pelvis was performed following the standard protocol without IV contrast. COMPARISON:  11/25/2011 FINDINGS: Lower chest: Clear lung bases. Hepatobiliary: No focal liver abnormality is seen. There has been interval cholecystectomy without evidence of significant biliary dilatation. Pancreas: Unremarkable. Spleen: Small volume intermediate attenuation perisplenic fluid or other material. Normal spleen size without focal parenchymal abnormality identified on this unenhanced study. Adrenals/Urinary Tract: Unremarkable adrenal glands. No evidence of renal mass, calculi, or hydronephrosis. No ureteral dilatation. Unremarkable bladder. Stomach/Bowel: The stomach is unremarkable. There is no evidence of bowel obstruction. The appendix is unremarkable. A moderate amount of stool is noted in the proximal colon. Vascular/Lymphatic: Normal caliber of the abdominal aorta. No enlarged lymph nodes. Reproductive: Grossly unremarkable uterus. Mild prominence of the ovaries with a possible cyst on the right. Other: Small volume intermediate attenuation fluid in the paracolic gutters bilaterally and likely in the pelvic cul-de-sac. Musculoskeletal: Grade 1 retrolisthesis of L4 on L5 with progressive, severe L4-5  disc degeneration including severe disc space narrowing and spurring resulting in mild spinal and moderate neural foraminal stenosis. IMPRESSION: 1. Small volume intermediate attenuation perisplenic and lower abdominal/pelvic fluid suggesting hemoperitoneum. This is of uncertain etiology, however given prominence of the right ovary and history of right lower quadrant pain, a ruptured ovarian cyst is a consideration. Splenic hemorrhage is not excluded, however there is no splenic enlargement nor is there a provided history of trauma or left upper quadrant pain, and therefore the perisplenic fluid is favored to reflect dependent movement of hemorrhage from the pelvis rather than indicating the primary site of hemorrhage. Pelvic ultrasound could be considered for further evaluation of the ovaries. 2. No urinary tract calculi or obstruction. 3. Progressive, severe L4-5 disc degeneration. Electronically Signed   By: Sebastian Ache M.D.   On: 02/04/2019 10:30    Procedures Procedures (including critical care time)  Medications Ordered in ED Medications  ketorolac (TORADOL) 15 MG/ML injection 15 mg (15 mg Intravenous Given 02/04/19 0919)  morphine 4 MG/ML injection 4 mg (4 mg Intravenous Given 02/04/19 0921)  ondansetron (ZOFRAN) injection 4 mg (4 mg Intravenous Given 02/04/19 0917)  HYDROmorphone (DILAUDID) injection 1 mg (1 mg Intravenous Given 02/04/19 1024)     Initial Impression / Assessment and Plan / ED Course  I have reviewed the triage vital signs and the nursing notes.  Pertinent labs & imaging results that were available during my care of the patient were reviewed by me and considered in my medical decision making (see chart for details).  33 year old female presents with acute right flank pain.  Differential includes kidney stone, pyelonephritis, sciatica, appendicitis, ovarian torsion, ruptured ovarian cyst.  Her vital signs are normal.  On initial exam she is in acute distress.  She is tender  diffusely over the low back, right flank, right lower quadrant.  Will obtain blood work, UA, pregnancy test, CT renal  CBC is normal.  CMP is normal.  UA shows small hemoglobin but is otherwise normal.  She is not pregnant.  CT renal is suspicious for ruptured ovarian cyst.  There is also mention of possible splenic laceration however I think this is unlikely given her history and presentation.  She does not have any left-sided abdominal pain.  Discussed with OB/GYN.  Since the patient is self-pay, will obtain a pelvic ultrasound today.  Pelvic ultrasound is remarkable for 2.9 complex cyst on the right ovary with small amount of free fluid.  Her pain is been controlled with medication here.  Advised to try  heating pad for the next several days and will prescribe some pain medicine.  Advised follow-up with OB/GYN and return if worsening.  Final Clinical Impressions(s) / ED Diagnoses   Final diagnoses:  Ruptured cyst of ovary    ED Discharge Orders    None       Bethel Born, PA-C 02/04/19 1355    Gerhard Munch, MD 02/04/19 1547

## 2019-02-04 NOTE — ED Notes (Signed)
Gave Pt cheese/cracker and sprite for PO challenge

## 2019-06-22 ENCOUNTER — Other Ambulatory Visit: Payer: Self-pay

## 2019-06-22 ENCOUNTER — Emergency Department (HOSPITAL_COMMUNITY)
Admission: EM | Admit: 2019-06-22 | Discharge: 2019-06-22 | Disposition: A | Discharge status: Home / Self Care | Payer: PRIVATE HEALTH INSURANCE | Attending: Emergency Medicine | Admitting: Emergency Medicine

## 2019-06-22 DIAGNOSIS — Y929 Unspecified place or not applicable: Secondary | ICD-10-CM | POA: Diagnosis not present

## 2019-06-22 DIAGNOSIS — Y990 Civilian activity done for income or pay: Secondary | ICD-10-CM | POA: Diagnosis not present

## 2019-06-22 DIAGNOSIS — S39012A Strain of muscle, fascia and tendon of lower back, initial encounter: Secondary | ICD-10-CM

## 2019-06-22 DIAGNOSIS — X500XXA Overexertion from strenuous movement or load, initial encounter: Secondary | ICD-10-CM | POA: Insufficient documentation

## 2019-06-22 DIAGNOSIS — Y9389 Activity, other specified: Secondary | ICD-10-CM | POA: Insufficient documentation

## 2019-06-22 DIAGNOSIS — M546 Pain in thoracic spine: Secondary | ICD-10-CM | POA: Insufficient documentation

## 2019-06-22 DIAGNOSIS — F17210 Nicotine dependence, cigarettes, uncomplicated: Secondary | ICD-10-CM | POA: Diagnosis not present

## 2019-06-22 DIAGNOSIS — M545 Low back pain: Secondary | ICD-10-CM | POA: Diagnosis present

## 2019-06-22 MED ORDER — METHOCARBAMOL 500 MG PO TABS
500.0000 mg | ORAL_TABLET | Freq: Once | ORAL | Status: AC
  Administered 2019-06-22: 14:00:00 500 mg via ORAL
  Filled 2019-06-22: qty 1

## 2019-06-22 MED ORDER — KETOROLAC TROMETHAMINE 15 MG/ML IJ SOLN
15.0000 mg | Freq: Once | INTRAMUSCULAR | Status: AC
  Administered 2019-06-22: 15 mg via INTRAMUSCULAR
  Filled 2019-06-22: qty 1

## 2019-06-22 MED ORDER — METHOCARBAMOL 500 MG PO TABS: 500.0000 mg | ORAL_TABLET | Freq: Two times a day (BID) | ORAL | 0 refills | Status: DC

## 2019-06-22 MED ORDER — MELOXICAM 7.5 MG PO TABS: 7.5000 mg | ORAL_TABLET | Freq: Every day | ORAL | 0 refills | Status: AC

## 2019-06-22 NOTE — ED Provider Notes (Signed)
[facility] EMERGENCY DEPARTMENT Provider Note   CSN: [id number] Arrival date & time: 06/22/19  1036     History   Chief Complaint No chief complaint on file.   HPI Molly Beard is a 33 y.o. female.     33 year old female with past medical history of degenerative disc disease in her lumbar region presents with complaint of diffuse back pain.  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  Patient denies falling, states she pain in her entire back, worse with trying to stand fully upright.  Denies abdominal pain, leg weakness or numbness, groin numbness, loss of bowel or bladder control.  No other injuries, complaints or concerns.     Past Medical History:  Diagnosis Date  . DDD (degenerative disc disease), lumbar   . Gallstone pancreatitis   . GERD (gastroesophageal reflux disease)     Patient Active Problem List   Diagnosis Date Noted  . Gallstone pancreatitis 07/17/2015  . 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 Syncope and collapse 08/25/2012  . Diplopia 08/25/2012  . UTI (lower urinary tract infection) 08/25/2012    Past Surgical History:  Procedure Laterality Date  . CHOLECYSTECTOMY N/A 07/19/2015   Procedure: LAPAROSCOPIC CHOLECYSTECTOMY WITH INTRAOPERATIVE CHOLANGIOGRAM;  Surgeon: Gaynelle Adu, MD;  Location: [facility] OR;  Service: General;  Laterality: N/A;  . TUBAL LIGATION  2010  . TUBAL LIGATION       OB History   No obstetric history on file.      Home Medications    Prior to Admission medications   Medication Sig Start Date End Date Taking? Authorizing Provider  acetaminophen (TYLENOL) 500 MG tablet Take 1,000 mg by mouth every 6 (six) hours as needed for mild pain or moderate pain.    [provider]  HYDROcodone-acetaminophen (NORCO/VICODIN) 5-325 MG tablet Take 2 tablets by mouth every 4 (four) hours as needed. 02/04/19   Bethel Born, PA-C   meloxicam (MOBIC) 7.5 MG tablet Take 1 tablet (7.5 mg total) by mouth daily for 10 days. 06/22/19 07/02/19  Jeannie Fend, PA-C  methocarbamol (ROBAXIN) 500 MG tablet Take 1 tablet (500 mg total) by mouth 2 (two) times daily. 06/22/19   Jeannie Fend, PA-C    Family History No family history on file.  Social History Social History   Tobacco Use  . Smoking status: Current Every Day Smoker    Packs/day: 0.12    Types: Cigarettes  . Smokeless tobacco: Never Used  Substance Use Topics  . Alcohol use: No  . Drug use: No     Allergies   Patient has no known allergies.   Review of Systems Review of Systems  Constitutional: Negative for fever.  Gastrointestinal: Negative for abdominal pain, constipation and diarrhea.  Genitourinary: Negative for difficulty urinating.  Musculoskeletal: Positive for back pain. Negative for arthralgias, gait problem, joint swelling, myalgias, neck pain and neck stiffness.  Skin: Negative for rash and wound.  Allergic/Immunologic: Negative for immunocompromised state.  Neurological: Negative for weakness and numbness.  Psychiatric/Behavioral: Negative for confusion.  All other systems reviewed and are negative.    Physical Exam Updated Vital Signs BP 111/74 (BP Location: Right Arm)   Pulse 62   Temp 98.5 F (36.9 C) (Oral)   Resp 16   SpO2 98%   Physical Exam Vitals signs and nursing note reviewed.  Constitutional:      General: She is not  in acute distress.    Appearance: She is well-developed. She is not diaphoretic.  HENT:     Head: Normocephalic and atraumatic.  Cardiovascular:     Pulses: Normal pulses.  Pulmonary:     Effort: Pulmonary effort is normal.  Musculoskeletal:        General: Tenderness present. No swelling, deformity or signs of injury.     Cervical back: She exhibits normal range of motion, no tenderness and no bony tenderness.     Thoracic back: She exhibits tenderness.     Lumbar back: She exhibits  tenderness.     Right lower leg: No edema.     Left lower leg: No edema.  Skin:    General: Skin is warm and dry.     Findings: No erythema or rash.  Neurological:     Mental Status: She is alert and oriented to person, place, and time.     Sensory: No sensory deficit.     Motor: No weakness.     Gait: Gait normal.     Deep Tendon Reflexes: Reflexes normal.     Reflex Scores:      Patellar reflexes are 1+ on the right side and 1+ on the left side.      Achilles reflexes are 1+ on the right side and 1+ on the left side. Psychiatric:        Behavior: Behavior normal.      ED Treatments / Results  Labs (all labs ordered are listed, but only abnormal results are displayed) Labs Reviewed - No data to display  EKG None  Radiology No results found.  Procedures Procedures (including critical care time)  Medications Ordered in ED Medications  ketorolac (TORADOL) 15 MG/ML injection 15 mg (15 mg Intramuscular Given 06/22/19 1341)  methocarbamol (ROBAXIN) tablet 500 mg (500 mg Oral Given 06/22/19 1341)     Initial Impression / Assessment and Plan / ED Course  I have reviewed the triage vital signs and the nursing notes.  Pertinent labs & imaging results that were available during my care of the patient were reviewed by me and considered in my medical decision making (see chart for details).  Clinical Course as of Jun 21 1424  Wed Jun 22, 5215  2936 33 year old female presents with complaint of diffuse back pain.  Patient states that she was at work when she slipped and twisted trying to catch something prevent falling.  Patient did not fall, no traumatic injury.  On exam patient has diffuse back tenderness.  Her leg strength and reflexes are symmetric.  Patient will be given Toradol and Robaxin while in the ER.  Discharged with meloxicam and Robaxin, recommend warm compresses and gentle exercises and follow-up with Worker's Comp. provider for recheck.   [LM]    Clinical Course  User Index [LM] Tacy Learn, PA-C      Final Clinical Impressions(s) / ED Diagnoses   Final diagnoses:  Back strain, initial encounter    ED Discharge Orders         Ordered    meloxicam (MOBIC) 7.5 MG tablet  Daily     06/22/19 1346    methocarbamol (ROBAXIN) 500 MG tablet  2 times daily     06/22/19 1346           Roque Lias 06/22/19 1425    Long, Wonda [facility], MD 06/22/19 1451

## 2019-06-22 NOTE — ED Triage Notes (Signed)
Patient complains of lower back injury after slipping while cleaning shower this am. Ambulatory, NAD

## 2019-06-22 NOTE — Discharge Instructions (Signed)
Stop taking Motrin. Take meloxicam and Robaxin as prescribed.  Do not drive or operate machinery if taking Robaxin. Apply warm compresses to your low back for 30 minutes at a time followed by gentle stretching as discussed. Follow-up with your primary care or Worker's Comp. provider in 2 days for recheck.

## 2019-10-17 ENCOUNTER — Ambulatory Visit (HOSPITAL_COMMUNITY)
Admission: EM | Admit: 2019-10-17 | Discharge: 2019-10-17 | Disposition: A | Discharge status: Home / Self Care | Payer: Self-pay | Attending: Family Medicine | Admitting: Family Medicine

## 2019-10-17 ENCOUNTER — Ambulatory Visit (INDEPENDENT_AMBULATORY_CARE_PROVIDER_SITE_OTHER): Payer: Self-pay

## 2019-10-17 ENCOUNTER — Other Ambulatory Visit: Payer: Self-pay

## 2019-10-17 ENCOUNTER — Encounter (HOSPITAL_COMMUNITY): Payer: Self-pay

## 2019-10-17 DIAGNOSIS — M25531 Pain in right wrist: Secondary | ICD-10-CM

## 2019-10-17 DIAGNOSIS — M25571 Pain in right ankle and joints of right foot: Secondary | ICD-10-CM

## 2019-10-17 DIAGNOSIS — Z3202 Encounter for pregnancy test, result negative: Secondary | ICD-10-CM

## 2019-10-17 DIAGNOSIS — M25561 Pain in right knee: Secondary | ICD-10-CM

## 2019-10-17 DIAGNOSIS — W19XXXA Unspecified fall, initial encounter: Secondary | ICD-10-CM

## 2019-10-17 DIAGNOSIS — W109XXA Fall (on) (from) unspecified stairs and steps, initial encounter: Secondary | ICD-10-CM

## 2019-10-17 LAB — POCT PREGNANCY, URINE: Preg Test, Ur: NEGATIVE

## 2019-10-17 LAB — POC URINE PREG, ED: Preg Test, Ur: NEGATIVE

## 2019-10-17 MED ORDER — NAPROXEN 500 MG PO TABS: 500.0000 mg | ORAL_TABLET | Freq: Two times a day (BID) | ORAL | 0 refills | Status: DC

## 2019-10-17 MED ORDER — CYCLOBENZAPRINE HCL 10 MG PO TABS: 10.0000 mg | ORAL_TABLET | Freq: Two times a day (BID) | ORAL | 0 refills | Status: DC | PRN

## 2019-10-17 NOTE — Discharge Instructions (Addendum)
Your x rays were normal without fractures.  Rest, Ice and elevate the injured areas Naproxen for pain and inflammation 2 x day.  Flexeril for muscle relaxant. Be aware this may make you drowsy.  Crutches as needed.  Follow up as needed for continued or worsening symptoms

## 2019-10-17 NOTE — ED Triage Notes (Signed)
Pt states she slipped and fell at home this morning and injured right side. C/o pain to right: hip, leg, foot, wrist, and lower back. Denies striking head or LOC.  States limited ROM to right toes. Neurovascular intact to right foot and wrist with +2 pulses. No obvious signs of deformity.  Took ibuprofen approx 0730 this morning.

## 2019-10-17 NOTE — ED Provider Notes (Addendum)
[facility]    CSN: [id number] Arrival date & time: 10/17/19  0915      History   Chief Complaint Chief Complaint  Patient presents with  . Fall    HPI Molly Beard is a 34 y.o. female.   Patient is a 34 year old female presents today for fall.  Reporting that she fell this morning down some slippery steps landing on the right side of her body.  She is complaining of right hip, right upper leg, right knee, right ankle and right wrist pain.  Mild pain in lower back with history of degenerative disc disease.  Symptoms have been constant since morning.  She has had trouble ambulating due to the right knee and ankle.  Reporting swelling.  Took ibuprofen at 730 this morning.  Mild numbness and tingling in the right leg.  No saddle paresthesias, loss of bowel or bladder function.  ROS per HPI    Fall    Past Medical History:  Diagnosis Date  . DDD (degenerative disc disease), lumbar   . Gallstone pancreatitis   . GERD (gastroesophageal reflux disease)     Patient Active Problem List   Diagnosis Date Noted  . Gallstone pancreatitis 07/17/2015  . 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 Syncope and collapse 08/25/2012  . Diplopia 08/25/2012  . UTI (lower urinary tract infection) 08/25/2012    Past Surgical History:  Procedure Laterality Date  . CHOLECYSTECTOMY N/A 07/19/2015   Procedure: LAPAROSCOPIC CHOLECYSTECTOMY WITH INTRAOPERATIVE CHOLANGIOGRAM;  Surgeon: Gaynelle Adu, MD;  Location: [facility] OR;  Service: General;  Laterality: N/A;  . TUBAL LIGATION  2010  . TUBAL LIGATION      OB History   No obstetric history on file.      Home Medications    Prior to Admission medications   Medication Sig Start Date End Date Taking? Authorizing Provider  cyclobenzaprine (FLEXERIL) 10 MG tablet Take 1 tablet (10 mg total) by mouth 2 (two) times daily as needed for muscle spasms. 10/17/19   Dahlia Byes A, NP  naproxen (NAPROSYN) 500 MG tablet Take 1 tablet (500 mg total) by mouth 2  (two) times daily. 10/17/19   Janace Aris, NP    Family History Family History  Problem Relation Age of Onset  . Healthy Mother   . Healthy Father     Social History Social History   Tobacco Use  . Smoking status: Current Every Day Smoker    Packs/day: 0.12    Types: Cigarettes  . Smokeless tobacco: Never Used  Substance Use Topics  . Alcohol use: No  . Drug use: No     Allergies   Patient has no known allergies.   Review of Systems Review of Systems  Musculoskeletal: Positive for arthralgias and back pain. Negative for neck pain.  Neurological: Positive for numbness.     Physical Exam Triage Vital Signs ED Triage Vitals  Enc Vitals Group     BP 10/17/19 1003 124/84     Pulse Rate 10/17/19 1003 80     Resp 10/17/19 1003 16     Temp 10/17/19 1003 98.4 F (36.9 C)     Temp Source 10/17/19 1003 Oral     SpO2 10/17/19 1003 99 %     Weight --      Height --      Head Circumference --      Peak Flow --      Pain Score 10/17/19 0958 8     Pain Loc --  Pain Edu? --      Excl. in [place]? --    No data found.  Updated Vital Signs BP 124/84 (BP Location: Right Arm)   Pulse 80   Temp 98.4 F (36.9 C) (Oral)   Resp 16   LMP 09/08/2019 Comment: tubal ligation  SpO2 99%   Visual Acuity Right Eye Distance:   Left Eye Distance:   Bilateral Distance:    Right Eye Near:   Left Eye Near:    Bilateral Near:     Physical Exam Vitals and nursing note reviewed.  Constitutional:      General: She is not in acute distress.    Appearance: Normal appearance. She is not ill-appearing, toxic-appearing or diaphoretic.  HENT:     Head: Normocephalic and atraumatic.     Nose: Nose normal.     Mouth/Throat:     Pharynx: Oropharynx is clear.  Eyes:     Conjunctiva/sclera: Conjunctivae normal.  Pulmonary:     Effort: Pulmonary effort is normal.  Abdominal:     Palpations: Abdomen is soft.  Musculoskeletal:        General: Signs of injury present.     Right  wrist: Swelling, tenderness and bony tenderness present. Decreased range of motion.     Cervical back: Normal range of motion.     Right knee: Swelling and bony tenderness present. Decreased range of motion. Tenderness present.     Right ankle: Swelling present. Tenderness present over the lateral malleolus. Decreased range of motion.     Right Achilles Tendon: Normal.  Skin:    General: Skin is warm and dry.     Findings: No rash.  Neurological:     Mental Status: She is alert.  Psychiatric:        Mood and Affect: Mood normal.      UC Treatments / Results  Labs (all labs ordered are listed, but only abnormal results are displayed) Labs Reviewed  POCT PREGNANCY, URINE  POC URINE PREG, ED    EKG   Radiology DG Wrist Complete Right  Result Date: 10/17/2019 CLINICAL DATA:  Fall, pain EXAM: RIGHT WRIST - COMPLETE 3+ VIEW COMPARISON:  None. FINDINGS: There is no evidence of acute fracture or dislocation. There may be a chronic fracture deformity of the distal right radius. There is no evidence of arthropathy or other focal bone abnormality. Soft tissues are unremarkable. IMPRESSION: No acute fracture or dislocation of the right wrist. The carpus is normally aligned. Electronically Signed   By: Eddie Candle M.D.   On: 10/17/2019 11:24   DG Ankle Complete Right  Result Date: 10/17/2019 CLINICAL DATA:  Fall, pain EXAM: RIGHT ANKLE - COMPLETE 3+ VIEW COMPARISON:  None. FINDINGS: There is no evidence of fracture, dislocation, or joint effusion. There is no evidence of arthropathy or other focal bone abnormality. Soft tissues are unremarkable. IMPRESSION: No fracture or dislocation of the right ankle. Electronically Signed   By: Eddie Candle M.D.   On: 10/17/2019 11:22   DG Knee Complete 4 Views Right  Result Date: 10/17/2019 CLINICAL DATA:  Fall, pain EXAM: RIGHT KNEE - COMPLETE 4+ VIEW COMPARISON:  None. FINDINGS: No evidence of fracture, dislocation, or joint effusion. No evidence of  arthropathy or other focal bone abnormality. Soft tissues are unremarkable. IMPRESSION: No fracture or dislocation of the right knee. Joint spaces are well preserved. Electronically Signed   By: Eddie Candle M.D.   On: 10/17/2019 11:25    Procedures Procedures (including critical care  time)  Medications Ordered in UC Medications - No data to display  Initial Impression / Assessment and Plan / UC Course  I have reviewed the triage vital signs and the nursing notes.  Pertinent labs & imaging results that were available during my care of the patient were reviewed by me and considered in my medical decision making (see chart for details).     Right leg pain after fall-x-rays without any acute fractures. Nothing concerning on exam. We will have her rest, ice, elevate areas.  Naproxen twice a day for pain inflammation and Flexeril as needed for muscle relaxant. Work note given for a few days off to rest Follow up as needed for continued or worsening symptoms Final Clinical Impressions(s) / UC Diagnoses   Final diagnoses:  Fall, initial encounter  Arthralgia of right lower leg  Right wrist pain     Discharge Instructions     Your x rays were normal without fractures.  Rest, Ice and elevate the injured areas Naproxen for pain and inflammation 2 x day.  Flexeril for muscle relaxant. Be aware this may make you drowsy.  Crutches as needed.  Follow up as needed for continued or worsening symptoms      ED Prescriptions    Medication Sig Dispense Auth. Provider   cyclobenzaprine (FLEXERIL) 10 MG tablet Take 1 tablet (10 mg total) by mouth 2 (two) times daily as needed for muscle spasms. 20 tablet Shahab Polhamus A, NP   naproxen (NAPROSYN) 500 MG tablet Take 1 tablet (500 mg total) by mouth 2 (two) times daily. 30 tablet Dahlia Byes A, NP     PDMP not reviewed this encounter.   Janace Aris, NP 10/17/19 1144    Dahlia Byes A, NP 10/17/19 1207

## 2020-01-03 ENCOUNTER — Encounter (HOSPITAL_COMMUNITY): Payer: Self-pay | Admitting: Emergency Medicine

## 2020-01-03 ENCOUNTER — Ambulatory Visit (HOSPITAL_COMMUNITY)
Admission: EM | Admit: 2020-01-03 | Discharge: 2020-01-03 | Disposition: A | Discharge status: Other Acute Inpatient Hospital | Payer: Self-pay

## 2020-01-03 ENCOUNTER — Emergency Department (HOSPITAL_COMMUNITY): Payer: Self-pay

## 2020-01-03 ENCOUNTER — Other Ambulatory Visit: Payer: Self-pay

## 2020-01-03 ENCOUNTER — Emergency Department (HOSPITAL_COMMUNITY)
Admission: EM | Admit: 2020-01-03 | Discharge: 2020-01-04 | Disposition: A | Discharge status: Home / Self Care | Payer: Self-pay | Attending: Emergency Medicine | Admitting: Emergency Medicine

## 2020-01-03 DIAGNOSIS — R1110 Vomiting, unspecified: Secondary | ICD-10-CM | POA: Insufficient documentation

## 2020-01-03 DIAGNOSIS — R1031 Right lower quadrant pain: Secondary | ICD-10-CM | POA: Insufficient documentation

## 2020-01-03 DIAGNOSIS — R1030 Lower abdominal pain, unspecified: Secondary | ICD-10-CM

## 2020-01-03 DIAGNOSIS — F17210 Nicotine dependence, cigarettes, uncomplicated: Secondary | ICD-10-CM | POA: Insufficient documentation

## 2020-01-03 LAB — COMPREHENSIVE METABOLIC PANEL
ALT: 13 U/L (ref 0–44)
AST: 17 U/L (ref 15–41)
Albumin: 4 g/dL (ref 3.5–5.0)
Alkaline Phosphatase: 54 U/L (ref 38–126)
Anion gap: 8 (ref 5–15)
BUN: 6 mg/dL (ref 6–20)
CO2: 24 mmol/L (ref 22–32)
Calcium: 9.2 mg/dL (ref 8.9–10.3)
Chloride: 107 mmol/L (ref 98–111)
Creatinine, Ser: 0.73 mg/dL (ref 0.44–1.00)
GFR calc Af Amer: >60 mL/min (ref >60)
GFR calc non Af Amer: >60 mL/min (ref >60)
Glucose, Bld: 100 mg/dL — ABNORMAL HIGH (ref 70–99)
Potassium: 3.8 mmol/L (ref 3.5–5.1)
Sodium: 139 mmol/L (ref 135–145)
Total Bilirubin: 0.6 mg/dL (ref 0.3–1.2)
Total Protein: 6.7 g/dL (ref 6.5–8.1)

## 2020-01-03 LAB — LIPASE, BLOOD: Lipase: 21 U/L (ref 11–51)

## 2020-01-03 LAB — CBC
HCT: 39.7 % (ref 36.0–46.0)
Hemoglobin: 13.2 g/dL (ref 12.0–15.0)
MCH: 30.6 pg (ref 26.0–34.0)
MCHC: 33.2 g/dL (ref 30.0–36.0)
MCV: 91.9 fL (ref 80.0–100.0)
Platelets: 312 10*3/uL (ref 150–400)
RBC: 4.32 MIL/uL (ref 3.87–5.11)
RDW: 12.6 % (ref 11.5–15.5)
WBC: 4.6 10*3/uL (ref 4.0–10.5)
nRBC: 0 % (ref 0.0–0.2)

## 2020-01-03 LAB — URINALYSIS, ROUTINE W REFLEX MICROSCOPIC
Bilirubin Urine: NEGATIVE
Glucose, UA: NEGATIVE mg/dL
Hgb urine dipstick: NEGATIVE
Ketones, ur: NEGATIVE mg/dL
Nitrite: NEGATIVE
Protein, ur: NEGATIVE mg/dL
Specific Gravity, Urine: 1.013 (ref 1.005–1.030)
pH: 8 (ref 5.0–8.0)

## 2020-01-03 LAB — I-STAT BETA HCG BLOOD, ED (MC, WL, AP ONLY): I-stat hCG, quantitative: <5 m[IU]/mL (ref <5)

## 2020-01-03 MED ORDER — HYDROMORPHONE HCL 1 MG/ML IJ SOLN
1.0000 mg | Freq: Once | INTRAMUSCULAR | Status: AC
  Administered 2020-01-03: 23:00:00 1 mg via INTRAVENOUS
  Filled 2020-01-03: qty 1

## 2020-01-03 MED ORDER — KETOROLAC TROMETHAMINE 30 MG/ML IJ SOLN
15.0000 mg | Freq: Once | INTRAMUSCULAR | Status: AC
  Administered 2020-01-03: 23:00:00 15 mg via INTRAVENOUS
  Filled 2020-01-03: qty 1

## 2020-01-03 MED ORDER — SODIUM CHLORIDE 0.9 % IV BOLUS
1000.0000 mL | Freq: Once | INTRAVENOUS | Status: AC
  Administered 2020-01-03: 20:00:00 1000 mL via INTRAVENOUS

## 2020-01-03 MED ORDER — ONDANSETRON HCL 4 MG/2ML IJ SOLN
4.0000 mg | Freq: Once | INTRAMUSCULAR | Status: AC
  Administered 2020-01-03: 4 mg via INTRAVENOUS
  Filled 2020-01-03: qty 2

## 2020-01-03 MED ORDER — IOHEXOL 300 MG/ML  SOLN
100.0000 mL | Freq: Once | INTRAMUSCULAR | Status: AC | PRN
  Administered 2020-01-03: 100 mL via INTRAVENOUS

## 2020-01-03 MED ORDER — FENTANYL CITRATE (PF) 100 MCG/2ML IJ SOLN
25.0000 ug | Freq: Once | INTRAMUSCULAR | Status: AC
  Administered 2020-01-03: 25 ug via INTRAVENOUS
  Filled 2020-01-03: qty 2

## 2020-01-03 NOTE — ED Triage Notes (Signed)
Pt reports squatting down while working and when she stood up, she began to have right lower abd pain and threw up. Endorses nausea. Hx of cyst

## 2020-01-03 NOTE — ED Notes (Signed)
Spoke w/Dr Tracie Harrier regarding pt being double over in pain and stating she thinks the pain is from a cyst ruptured on her appendics and this happened in the past, he stated to send her to the ED.

## 2020-01-03 NOTE — ED Notes (Signed)
[phone] pts husband Minerva Areola, have pt call back

## 2020-01-03 NOTE — ED Notes (Signed)
When pt is getting discharged call husband Minerva Areola ahead for pick up, thank you  [phone]

## 2020-01-03 NOTE — ED Provider Notes (Signed)
[facility] EMERGENCY DEPARTMENT Provider Note   CSN: [id number] Arrival date & time: 01/03/20  1149     History Chief Complaint  Patient presents with   Abdominal Pain    Molly Beard is a 34 y.o. female.  HPI     Patient presents with concern of right lower quadrant abdominal pain, nausea, vomiting. Patient is generally well, was in her usual state of health until about 6 hours ago, when she was bending over, felt sudden onset of pain in the right lower quadrant.  Since that time she has had pain in that area, described as sharp, severe.  There is associated nausea, anorexia, and she has had episodes of vomiting. Patient has a notable history of prior ovarian cyst with rupture, as well as prior cholecystectomy and tubal ligation. Since onset she has been hesitant to take medication for relief due to concerns of worsening nausea.   Past Medical History:  Diagnosis Date   DDD (degenerative disc disease), lumbar    Gallstone pancreatitis    GERD (gastroesophageal reflux disease)     Patient Active Problem List   Diagnosis Date Noted   Gallstone pancreatitis 07/17/2015   Stuttering 08/25/2012   Syncope and collapse 08/25/2012   Diplopia 08/25/2012   UTI (lower urinary tract infection) 08/25/2012    Past Surgical History:  Procedure Laterality Date   CHOLECYSTECTOMY N/A 07/19/2015   Procedure: LAPAROSCOPIC CHOLECYSTECTOMY WITH INTRAOPERATIVE CHOLANGIOGRAM;  Surgeon: Gaynelle Adu, MD;  Location: [facility] OR;  Service: General;  Laterality: N/A;   TUBAL LIGATION  2010   TUBAL LIGATION       OB History   No obstetric history on file.     Family History  Problem Relation Age of Onset   Healthy Mother    Healthy Father     Social History   Tobacco Use   Smoking status: Current Every Day Smoker    Packs/day: 0.12    Types: Cigarettes   Smokeless tobacco: Never Used  Substance Use Topics   Alcohol use: No   Drug use: No     Home Medications Prior to Admission medications   Medication Sig Start Date End Date Taking? Authorizing Provider  acetaminophen (TYLENOL) 500 MG tablet Take 1,500 mg by mouth 3 (three) times daily as needed (for pain).   Yes [provider]  ibuprofen (ADVIL) 200 MG tablet Take 200-800 mg by mouth every 6 (six) hours as needed (for pain).   Yes [provider]  cyclobenzaprine (FLEXERIL) 10 MG tablet Take 1 tablet (10 mg total) by mouth 2 (two) times daily as needed for muscle spasms. Patient not taking: Reported on 01/03/2020 10/17/19   Dahlia Byes A, NP  naproxen (NAPROSYN) 500 MG tablet Take 1 tablet (500 mg total) by mouth 2 (two) times daily. Patient not taking: Reported on 01/03/2020 10/17/19   Janace Aris, NP    Allergies    Patient has no known allergies.  Review of Systems   Review of Systems  Constitutional:       Per HPI, otherwise negative  HENT:       Per HPI, otherwise negative  Respiratory:       Per HPI, otherwise negative  Cardiovascular:       Per HPI, otherwise negative  Gastrointestinal: Positive for abdominal pain, nausea and vomiting.  Endocrine:       Negative aside from HPI  Genitourinary:       Neg aside from HPI   Musculoskeletal:  Per HPI, otherwise negative  Skin: Negative.   Neurological: Negative for syncope.    Physical Exam Updated Vital Signs BP 109/60 (BP Location: Right Arm)    Pulse (!) 59    Temp 97.8 F (36.6 C) (Oral)    Resp 18    Ht 5\' 4"  (1.626 m)    Wt 59 kg    SpO2 99%    BMI 22.31 kg/m   Physical Exam Vitals and nursing note reviewed.  Constitutional:      General: She is not in acute distress.    Appearance: She is well-developed.  HENT:     Head: Normocephalic and atraumatic.  Eyes:     Conjunctiva/sclera: Conjunctivae normal.  Cardiovascular:     Rate and Rhythm: Normal rate and regular rhythm.  Pulmonary:     Effort: Pulmonary effort is normal. No respiratory distress.     Breath sounds:  Normal breath sounds. No stridor.  Abdominal:     General: There is no distension.     Tenderness: There is abdominal tenderness in the right lower quadrant.  Skin:    General: Skin is warm and dry.  Neurological:     Mental Status: She is alert and oriented to person, place, and time.     Cranial Nerves: No cranial nerve deficit.     ED Results / Procedures / Treatments   Labs (all labs ordered are listed, but only abnormal results are displayed) Labs Reviewed  COMPREHENSIVE METABOLIC PANEL - Abnormal; Notable for the following components:      Result Value   Glucose, Bld 100 (*)    All other components within normal limits  URINALYSIS, ROUTINE W REFLEX MICROSCOPIC - Abnormal; Notable for the following components:   APPearance HAZY (*)    Leukocytes,Ua TRACE (*)    Bacteria, UA RARE (*)    All other components within normal limits  LIPASE, BLOOD  CBC  I-STAT BETA HCG BLOOD, ED ([facility], [facility], [facility] ONLY)    Radiology CT Abdomen Pelvis W Contrast  Result Date: 01/03/2020 CLINICAL DATA:  Right lower quadrant pain. Nausea and vomiting. Abdominal abscess/infection suspected RLQ pain, n/v EXAM: CT ABDOMEN AND PELVIS WITH CONTRAST TECHNIQUE: Multidetector CT imaging of the abdomen and pelvis was performed using the standard protocol following bolus administration of intravenous contrast. CONTRAST:  01/05/2020 OMNIPAQUE IOHEXOL 300 MG/ML  SOLN COMPARISON:  Noncontrast abdominopelvic CT 02/04/2019 FINDINGS: Lower chest: The lung bases are clear. Hepatobiliary: No focal liver abnormality is seen. Status post cholecystectomy. No biliary dilatation. Pancreas: No evidence of pancreatic mass or ductal dilatation. There is some mesenteric edema at the root of the mesentery of the does not appear related to the pancreas. No definite peripancreatic inflammation. Spleen: Normal in size without focal abnormality. Adrenals/Urinary Tract: Normal adrenal glands. No hydronephrosis or perinephric edema. Homogeneous  renal enhancement. Urinary bladder is physiologically distended without wall thickening. Stomach/Bowel: Bowel evaluation is limited in the absence of enteric contrast and paucity of intra-abdominal fat. Normal appendix, for example series 6, image 70 and series 3, image 55. no periappendiceal fat stranding or appendicitis. Stomach is unremarkable. Normal positioning of the ligament of Treitz. No small bowel obstruction or evidence of inflammation. Terminal ileum is normal. Small to moderate volume of stool in the colon. No colonic wall thickening. No colonic inflammation. There is minimal edema at the root of the mesentery, series 3, image 33, however vessels in bowel loops in this region appear normal. Vascular/Lymphatic: Normal caliber abdominal aorta. The portal vein is  patent. The mesenteric vessels are patent. No adenopathy. Reproductive: Retroverted uterus with nonspecific soft tissue prominence in the lower uterine segment/cervix. Ovaries are difficult to define but appear grossly symmetric and normal in size. No suspicious adnexal mass. No pelvic free fluid or focal fluid collection. Other: No free air, free fluid, or intra-abdominal fluid collection. Mild edema at the root of the mesentery, without adenopathy or associated bowel abnormalities. Musculoskeletal: Degenerative disc disease at L4-L5 similar to prior exam. There are no acute or suspicious osseous abnormalities. IMPRESSION: 1. Normal appendix.  No abscess. 2. Mild mesenteric edema at the root of the mesentery. This is likely spurious, as there is no associated adenopathy, vessel or bowel abnormality in the region of edema. 3. Retroverted uterus. Nonspecific soft tissue prominence in the lower uterine segment/cervix, possibly related to retroverted positioning. Ovaries symmetric in size without evidence of adnexal mass on CT. Electronically Signed   By: Keith Rake M.D.   On: 01/03/2020 20:36    Procedures Procedures (including critical  care time)  Medications Ordered in ED Medications  HYDROmorphone (DILAUDID) injection 1 mg (has no administration in time range)  ketorolac (TORADOL) 30 MG/ML injection 15 mg (has no administration in time range)  sodium chloride 0.9 % bolus 1,000 mL (1,000 mLs Intravenous New Bag/Given 01/03/20 1939)  fentaNYL (SUBLIMAZE) injection 25 mcg (25 mcg Intravenous Given 01/03/20 1939)  ondansetron (ZOFRAN) injection 4 mg (4 mg Intravenous Given 01/03/20 1939)  iohexol (OMNIPAQUE) 300 MG/ML solution 100 mL (100 mLs Intravenous Contrast Given 01/03/20 2012)    ED Course  I have reviewed the triage vital signs and the nursing notes.  Pertinent labs & imaging results that were available during my care of the patient were reviewed by me and considered in my medical decision making (see chart for details).   10:51 PM The patient now has obvious pain on arrival in spite of initial fluids, meds, antiemetics.  CT scan results reviewed, subsequently discussed with the patient, by the physician assistant student.  With consideration of gynecologic pathology, ultrasound will be performed.   Previously well young female with history of prior ovarian issues presents with right lower quadrant abdominal pain.  Patient has some nausea, but is awake, alert, no evidence of peritonitis. However, with abnormal initial CT scan, ongoing pain, consideration of inflammatory versus ovarian pathology, patient will have ultrasound performed.  12:00 AM Patient at [place].  Dr. Christy Gentles is aware of the patient and will reassess / dispo.    Final Clinical Impression(s) / ED Diagnoses Final diagnoses:  Right lower quadrant abdominal pain     Carmin Muskrat, MD 01/04/20 0003

## 2020-01-04 NOTE — Discharge Instructions (Signed)

## 2020-01-04 NOTE — ED Notes (Signed)
Patient verbalizes understanding of discharge instructions. Opportunity for questioning and answers were provided. Armband removed by staff, pt discharged from ED.  

## 2020-01-04 NOTE — ED Provider Notes (Signed)
I assumed care in signout to follow-up on ultrasound imaging.  No acute findings on ultrasound.  Patient appears improved, eating fast food.  She will be  referred to the women's outpatient clinic.   Zadie Rhine, MD 01/04/20 [phone]

## 2021-08-07 IMAGING — US US TRANSVAGINAL NON-OB
1 series · 13 of 25 positions shown · non-contrast
Comparison: 02/04/2019

CLINICAL DATA: Right lower quadrant and pelvic pain

EXAM:
TRANSABDOMINAL AND TRANSVAGINAL ULTRASOUND OF PELVIS
DOPPLER ULTRASOUND OF OVARIES
TECHNIQUE: Both transabdominal and transvaginal ultrasound examinations of the
pelvis were performed. Transabdominal technique was performed for
global imaging of the pelvis including uterus, ovaries, adnexal
regions, and pelvic cul-de-sac.
It was necessary to proceed with endovaginal exam following the
transabdominal exam to visualize the uterus, endometrium, ovaries
and adnexa. Color and duplex Doppler ultrasound was utilized to
evaluate blood flow to the ovaries.

[Series 1: us pelvis (transabdominal only) · 13 of 107 slices shown]
[im 1/107]
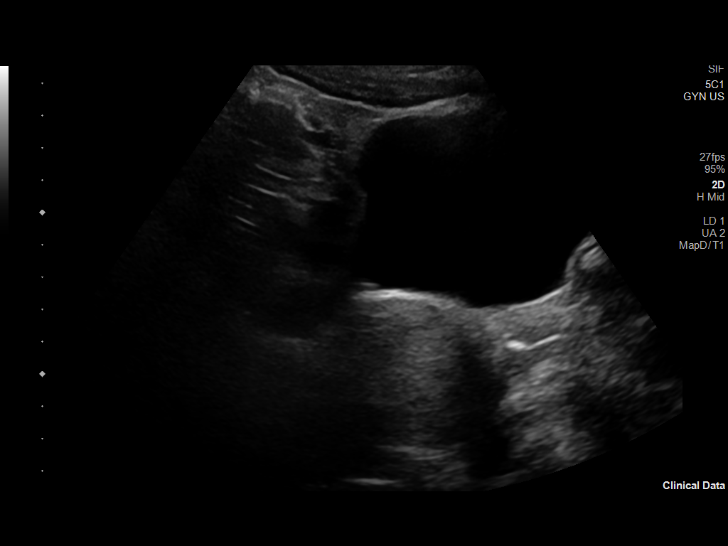
[im 9/107]
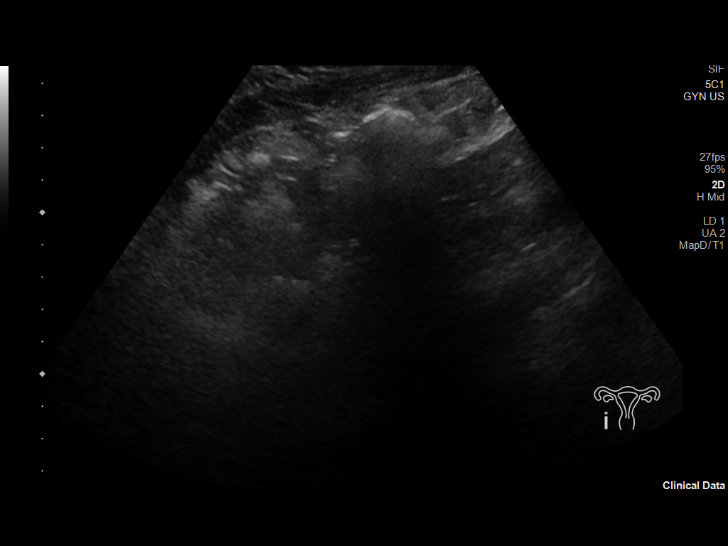
[im 18/107]
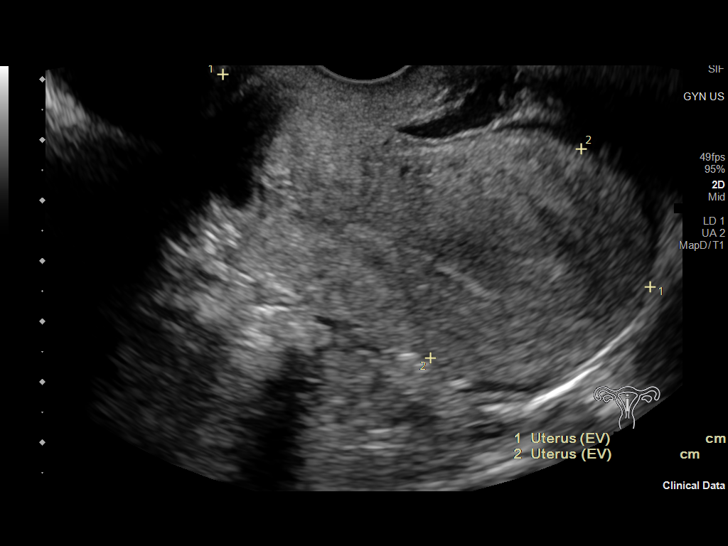
[im 27/107]
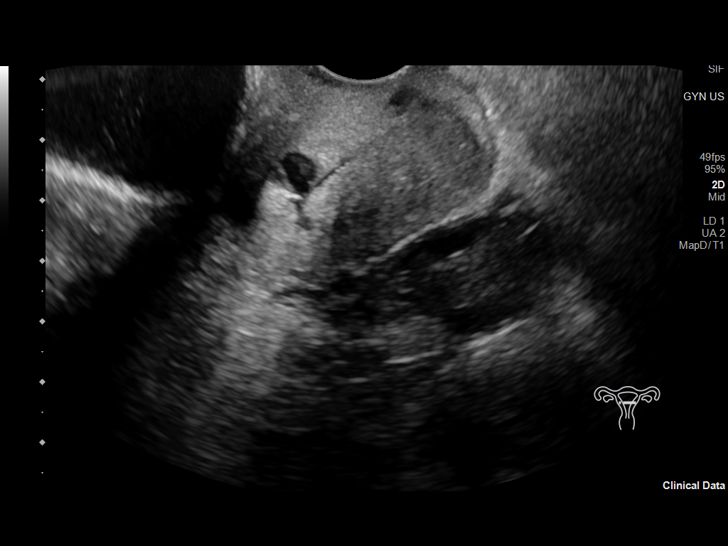
[im 36/107]
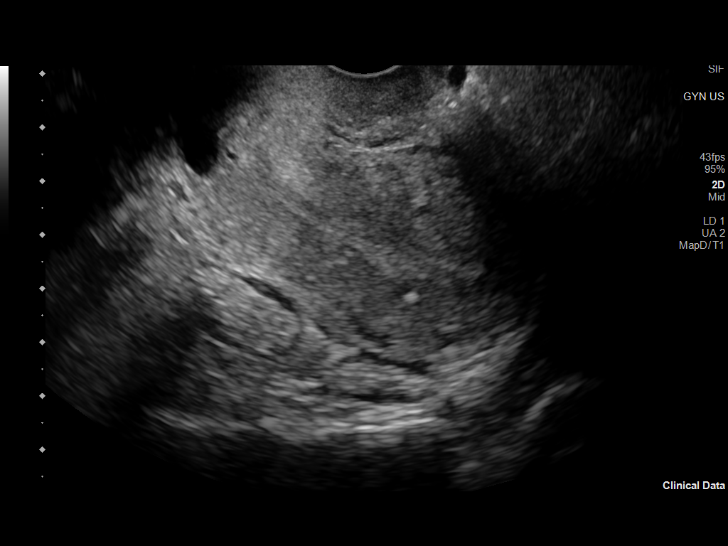
[im 45/107]
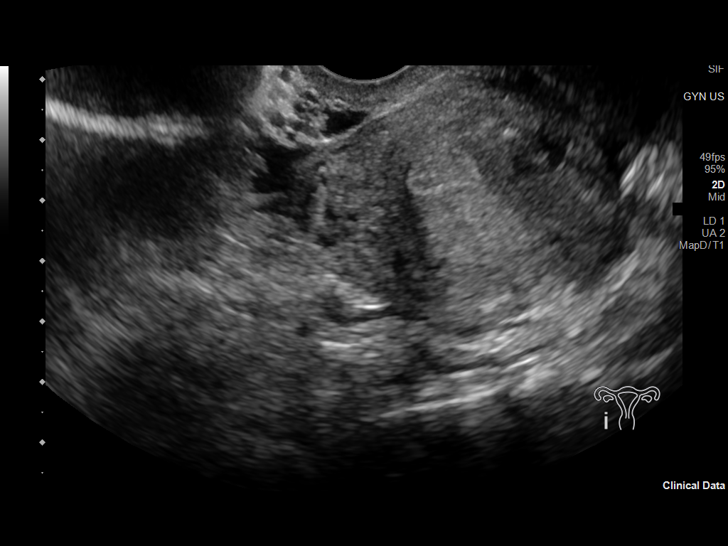
[im 54/107]
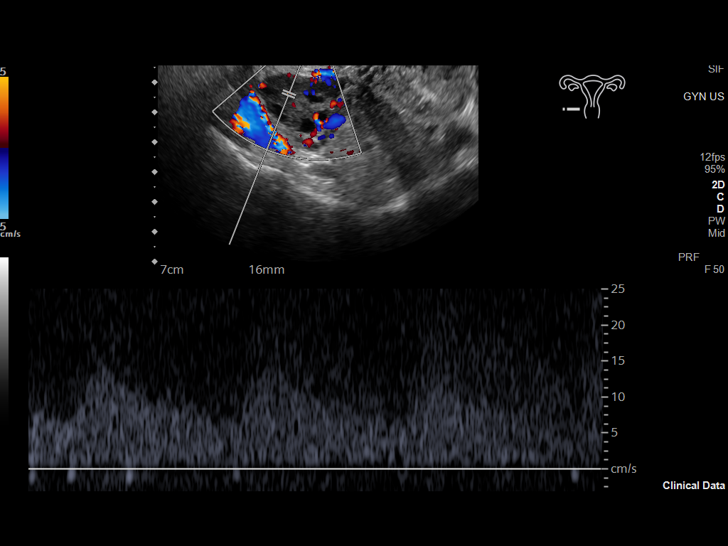
[im 62/107]
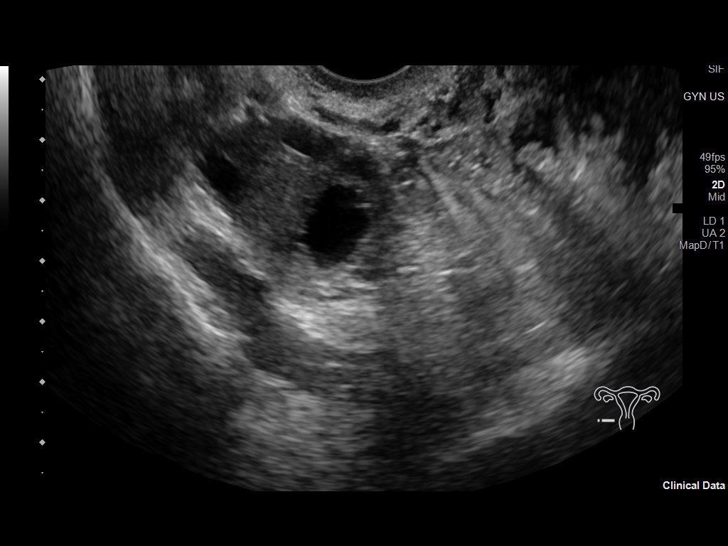
[im 71/107]
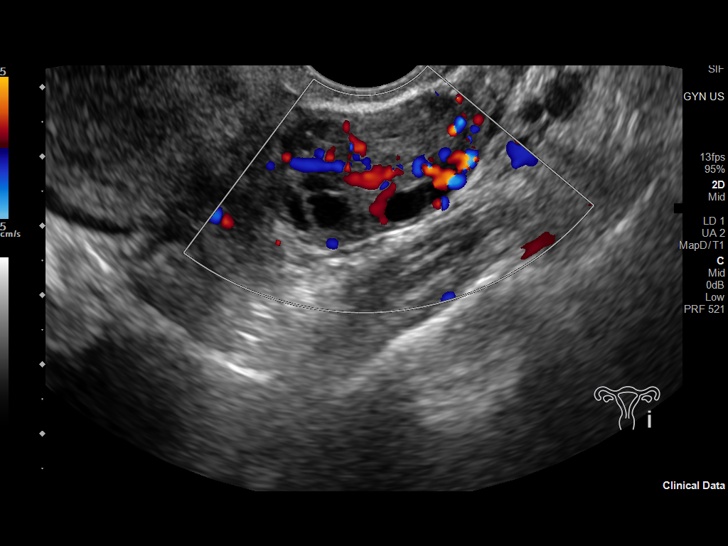
[im 80/107]
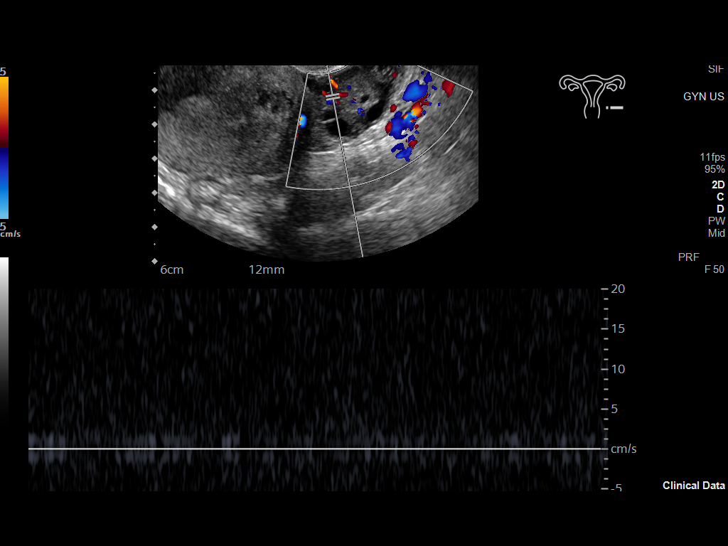
[im 89/107]
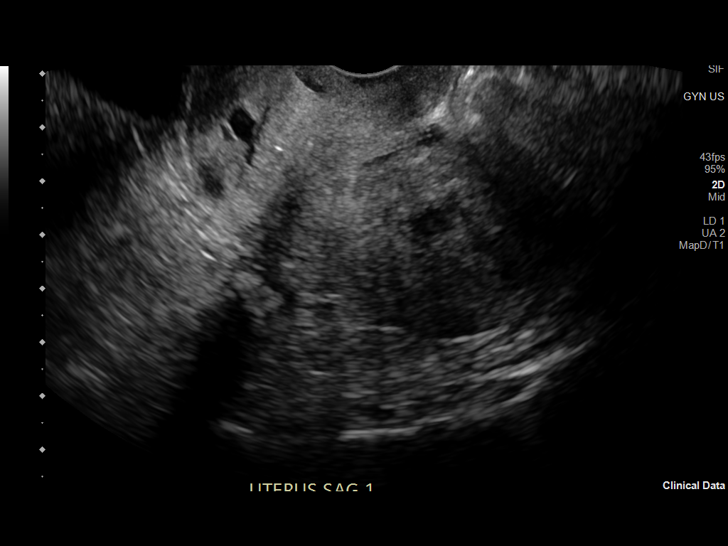
[im 98/107]
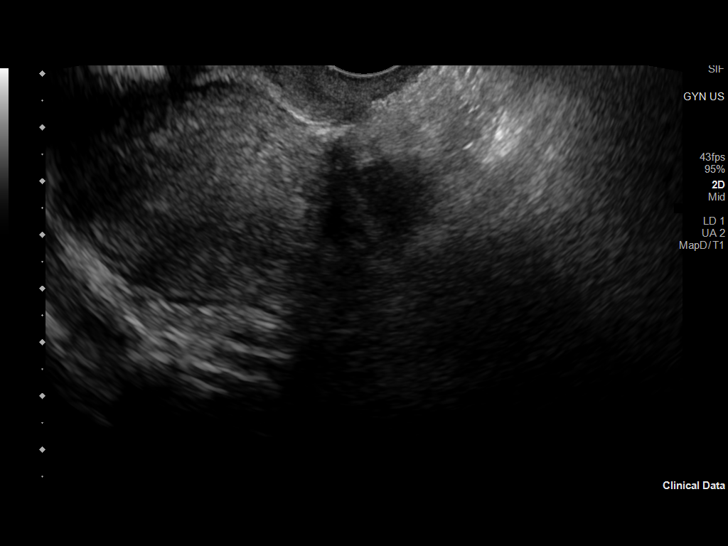
[im 107/107]
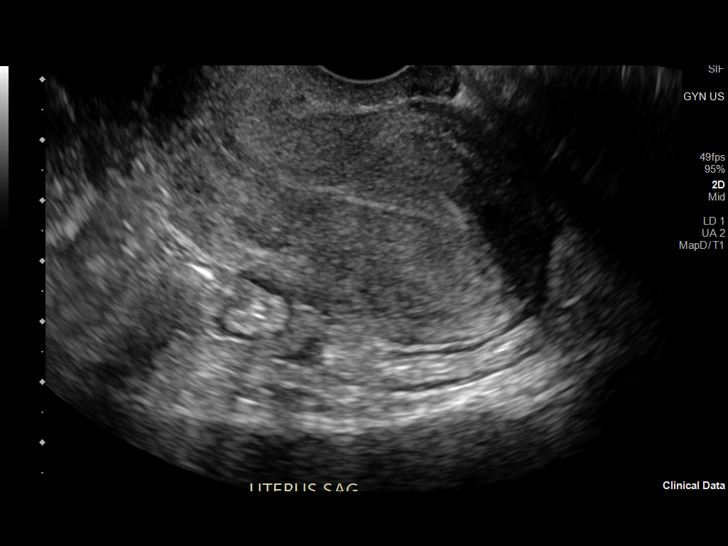

[13 of 25 positions shown; findings below may reference images not displayed]

FINDINGS: Uterus

Measurements: 7.9 x 4.3 x 5.1 cm = volume: 89 mL. Small left
posterior fibroid, 1 cm.

Endometrium

Thickness: 4 mm in thickness.  No focal abnormality visualized.

Right ovary

Measurements: 3.2 x 1.9 x 2.4 cm = volume: 7.7 mL. Normal
appearance/no adnexal mass. Dominant 1.6 cm follicle.

Left ovary

Measurements: 3.3 x 1.6 x 2.5 cm = volume: 6.9 mL. Normal
appearance/no adnexal mass.

Pulsed Doppler evaluation of both ovaries demonstrates normal
low-resistance arterial and venous waveforms.

Other findings

Small amount of free fluid.
IMPRESSION: No acute findings.

No evidence of ovarian torsion.

## 2021-08-07 IMAGING — CT CT ABD-PELV W/ CM
2 of 4 series · 15 of 46 positions shown, 17 images · IV contrast (omnipaque)
Comparison: Noncontrast abdominopelvic CT 02/04/2019

CLINICAL DATA: Right lower quadrant pain. Nausea and vomiting.

Abdominal abscess/infection suspected RLQ pain, n/v
EXAM:
CT ABDOMEN AND PELVIS WITH CONTRAST
TECHNIQUE: Multidetector CT imaging of the abdomen and pelvis was performed
using the standard protocol following bolus administration of
intravenous contrast.
CONTRAST:  100mL OMNIPAQUE IOHEXOL 300 MG/ML  SOLN

[Series 3: a/p w/ 5mm · axial · 0.80mm/px · z∈[+736,+1146]mm · 12 of 90 slices shown, 14 images]
[im 4/90  soft-tissue]
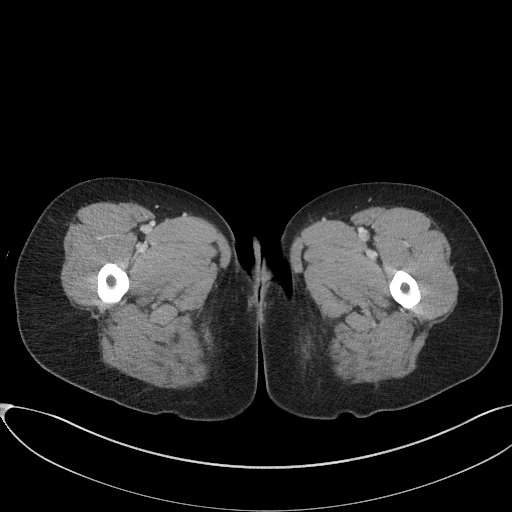
[im 4/90  bone]
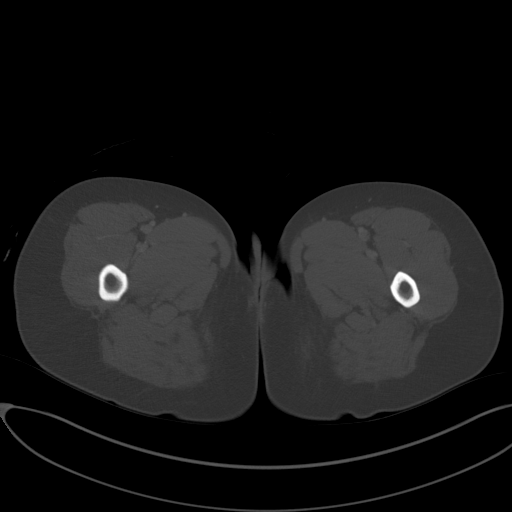
[im 12/90  soft-tissue]
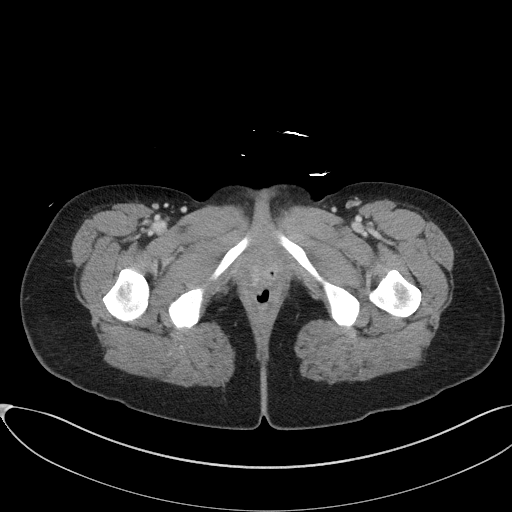
[im 20/90  soft-tissue]
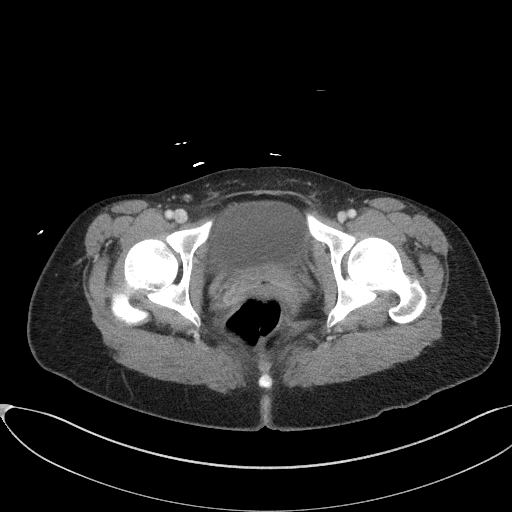
[im 28/90  soft-tissue]
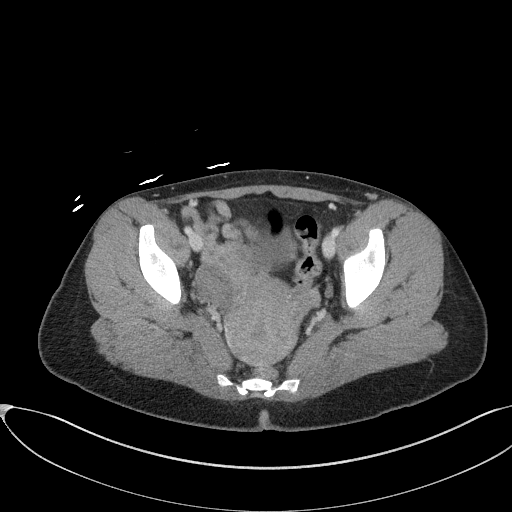
[im 35/90  soft-tissue]
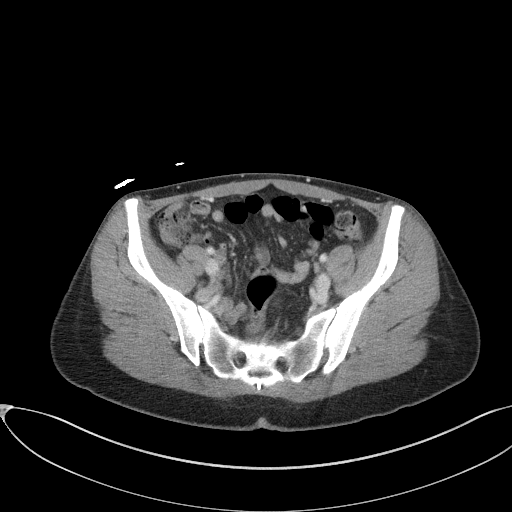
[im 43/90  soft-tissue]
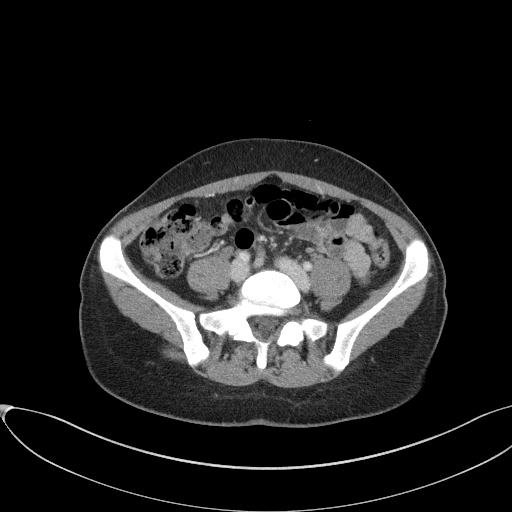
[im 47/90  soft-tissue]
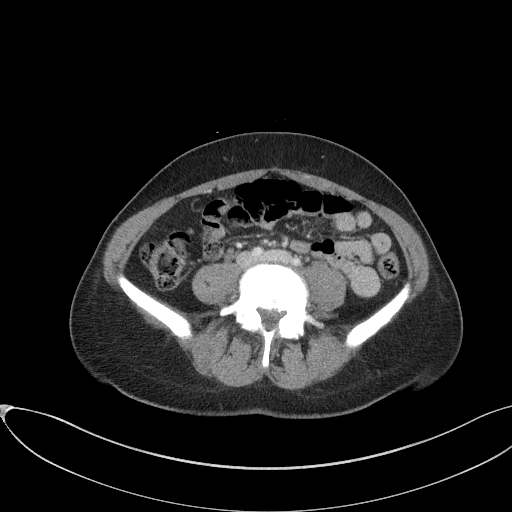
[im 55/90  soft-tissue]
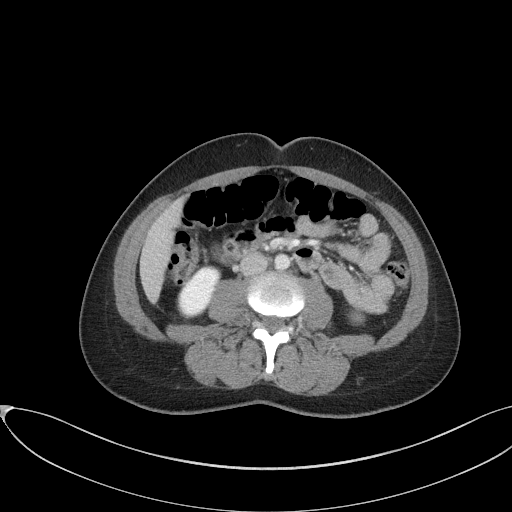
[im 62/90  soft-tissue]
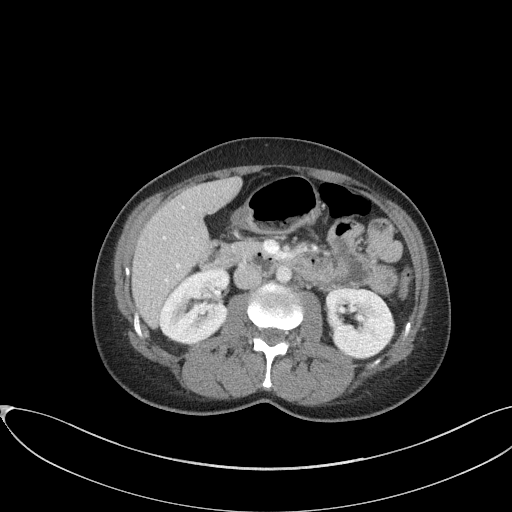
[im 62/90  bone]
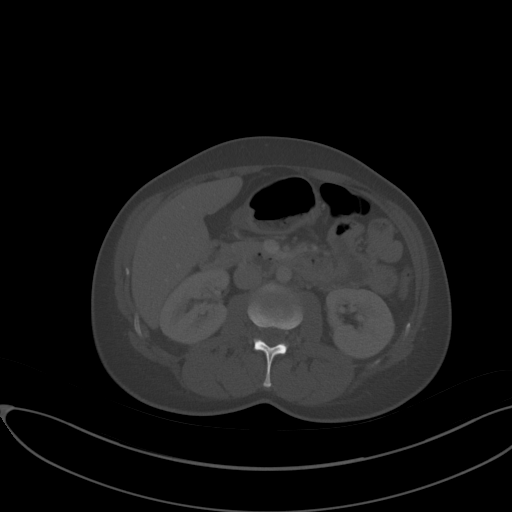
[im 70/90  soft-tissue]
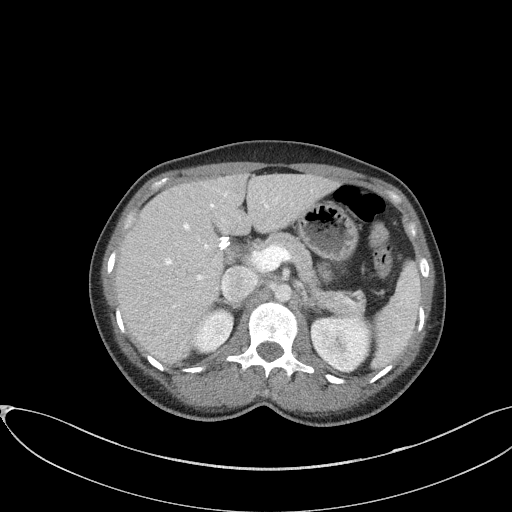
[im 78/90  soft-tissue]
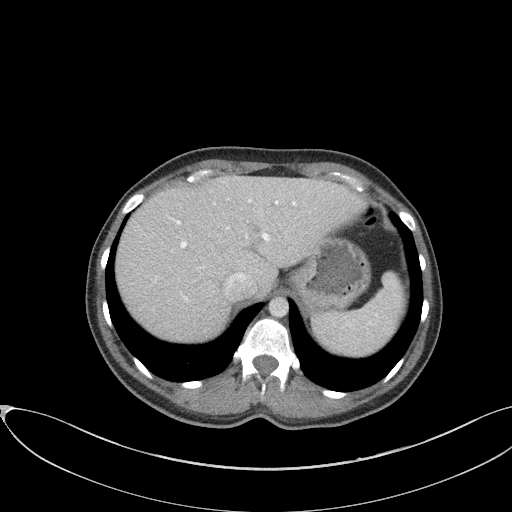
[im 86/90  soft-tissue]
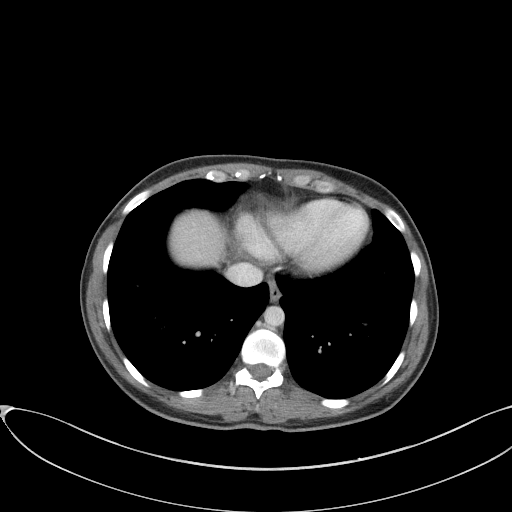

[Series 6: a/p w/ cor · coronal · 0.80mm/px · 3 of 145 slices shown]
[im 49/145  soft-tissue]
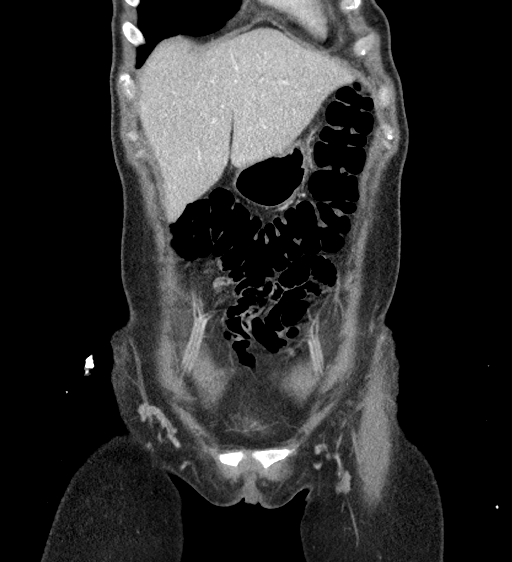
[im 65/145  soft-tissue]
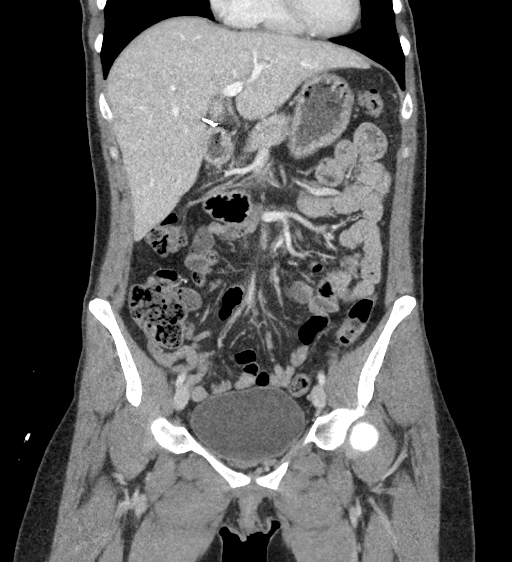
[im 81/145  soft-tissue]
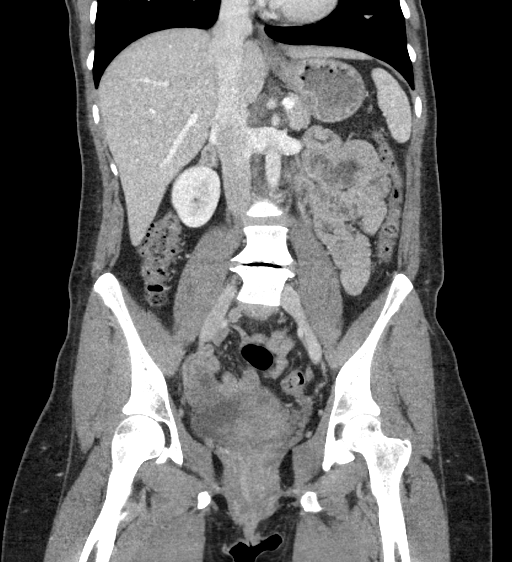

[15 of 46 positions shown; findings below may reference images not displayed]

FINDINGS: Lower chest: The lung bases are clear.

Hepatobiliary: No focal liver abnormality is seen. Status post
cholecystectomy. No biliary dilatation.

Pancreas: No evidence of pancreatic mass or ductal dilatation. There
is some mesenteric edema at the root of the mesentery of the does
not appear related to the pancreas. No definite peripancreatic
inflammation.

Spleen: Normal in size without focal abnormality.

Adrenals/Urinary Tract: Normal adrenal glands. No hydronephrosis or
perinephric edema. Homogeneous renal enhancement. Urinary bladder is
physiologically distended without wall thickening.

Stomach/Bowel: Bowel evaluation is limited in the absence of enteric
contrast and paucity of intra-abdominal fat.

Normal appendix, for example series 6, image 70 and series 3, image
55. no periappendiceal fat stranding or appendicitis. Stomach is
unremarkable. Normal positioning of the ligament of Treitz. No small
bowel obstruction or evidence of inflammation. Terminal ileum is
normal. Small to moderate volume of stool in the colon. No colonic
wall thickening. No colonic inflammation. There is minimal edema at
the root of the mesentery, series 3, image 33, however vessels in
bowel loops in this region appear normal.

Vascular/Lymphatic: Normal caliber abdominal aorta. The portal vein
is patent. The mesenteric vessels are patent. No adenopathy.

Reproductive: Retroverted uterus with nonspecific soft tissue
prominence in the lower uterine segment/cervix. Ovaries are
difficult to define but appear grossly symmetric and normal in size.
No suspicious adnexal mass. No pelvic free fluid or focal fluid
collection.

Other: No free air, free fluid, or intra-abdominal fluid collection.
Mild edema at the root of the mesentery, without adenopathy or
associated bowel abnormalities.

Musculoskeletal: Degenerative disc disease at L4-L5 similar to prior
exam. There are no acute or suspicious osseous abnormalities.
IMPRESSION: 1. Normal appendix.  No abscess.
2. Mild mesenteric edema at the root of the mesentery. This is
likely spurious, as there is no associated adenopathy, vessel or
bowel abnormality in the region of edema.
3. Retroverted uterus. Nonspecific soft tissue prominence in the
lower uterine segment/cervix, possibly related to retroverted
positioning. Ovaries symmetric in size without evidence of adnexal
mass on CT.

## 2021-08-07 IMAGING — US US ART/VEN ABD/PELV/SCROTUM DOPPLER LTD
1 series · 13 of 25 positions shown · non-contrast
Comparison: 02/04/2019

CLINICAL DATA: Right lower quadrant and pelvic pain

EXAM:
TRANSABDOMINAL AND TRANSVAGINAL ULTRASOUND OF PELVIS
DOPPLER ULTRASOUND OF OVARIES
TECHNIQUE: Both transabdominal and transvaginal ultrasound examinations of the
pelvis were performed. Transabdominal technique was performed for
global imaging of the pelvis including uterus, ovaries, adnexal
regions, and pelvic cul-de-sac.
It was necessary to proceed with endovaginal exam following the
transabdominal exam to visualize the uterus, endometrium, ovaries
and adnexa. Color and duplex Doppler ultrasound was utilized to
evaluate blood flow to the ovaries.

[Series 1: us pelvis (transabdominal only) · 13 of 107 slices shown]
[im 1/107]
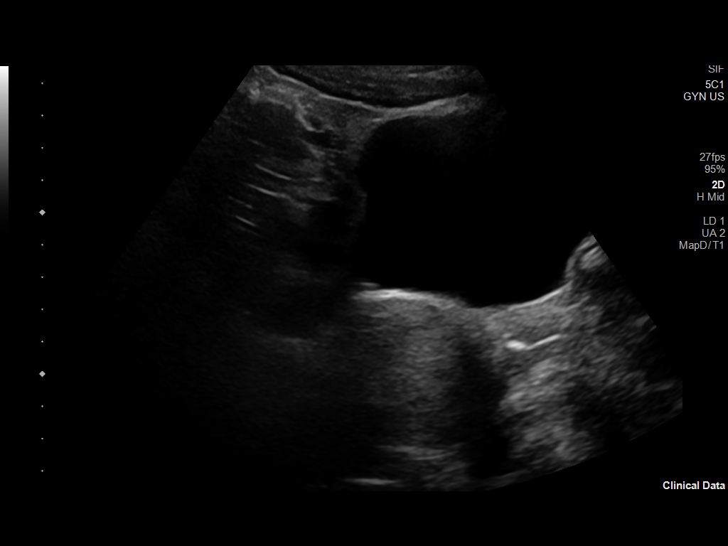
[im 9/107]
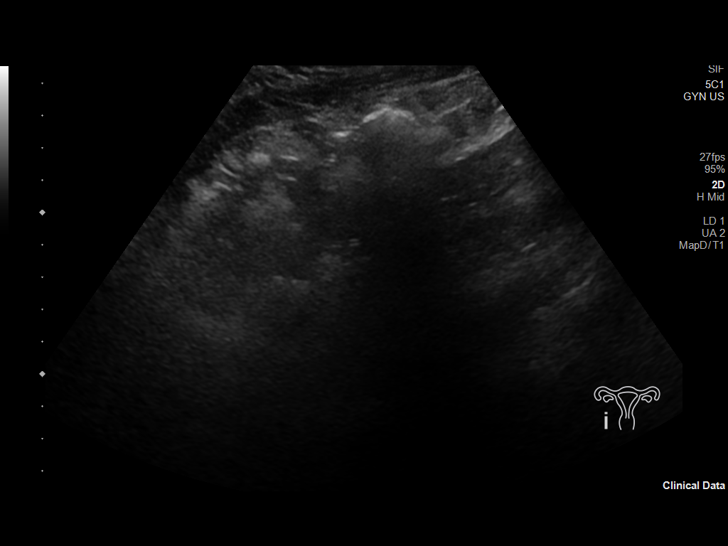
[im 18/107]
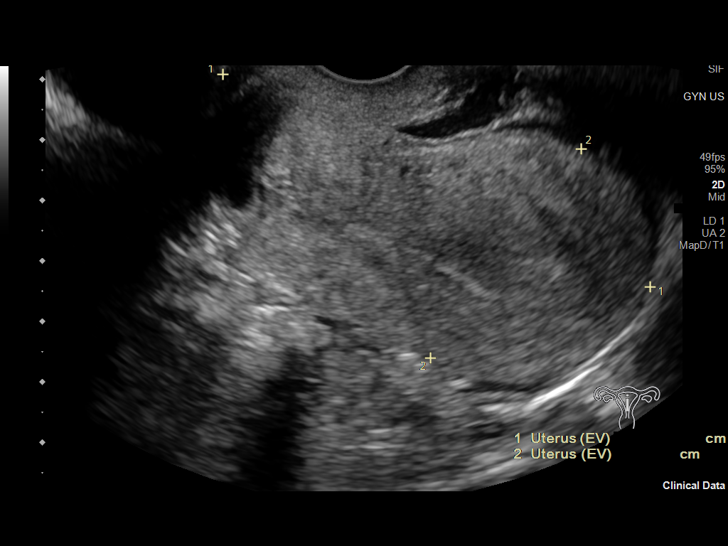
[im 27/107]
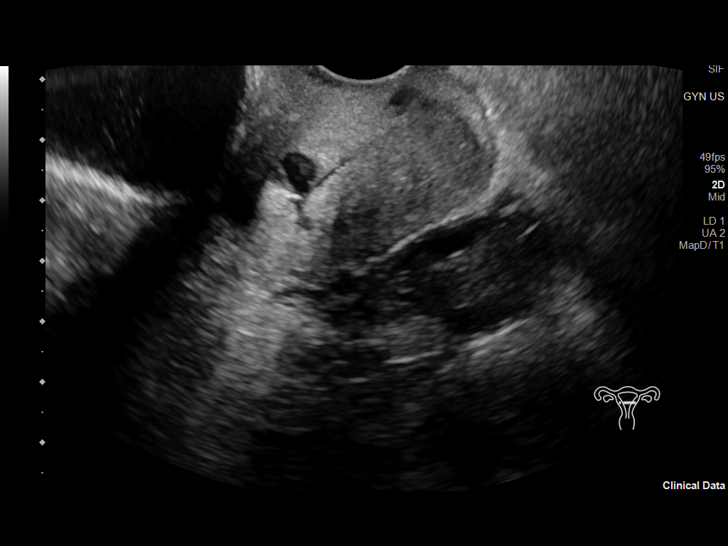
[im 36/107]
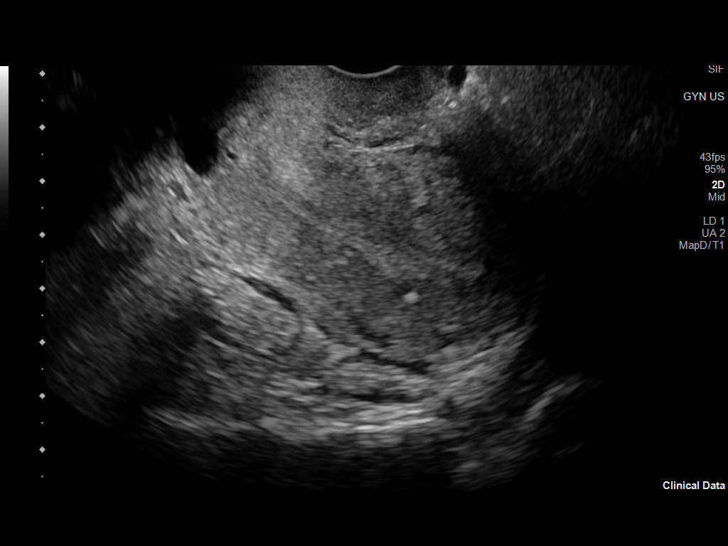
[im 45/107]
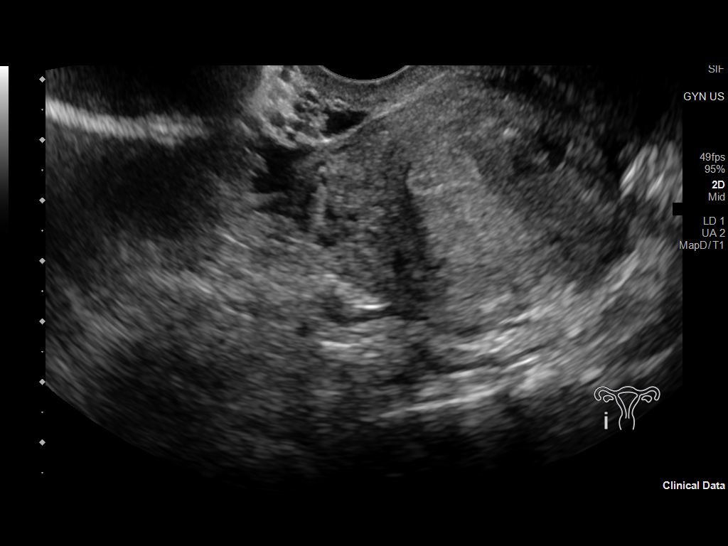
[im 54/107]
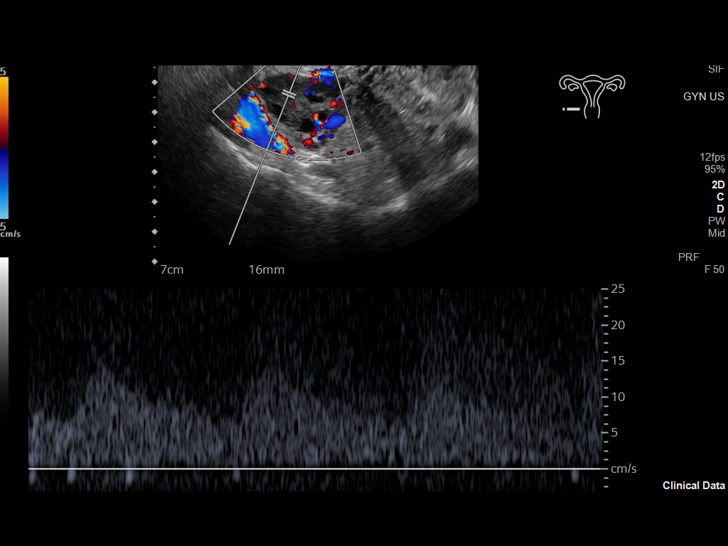
[im 62/107]
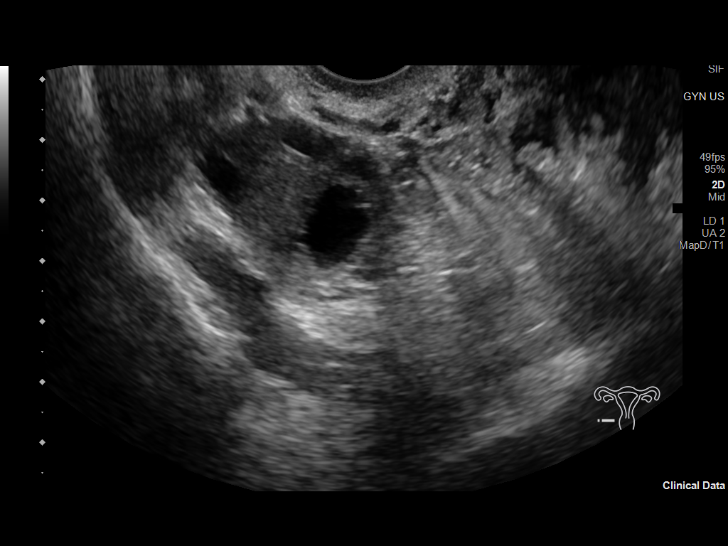
[im 71/107]
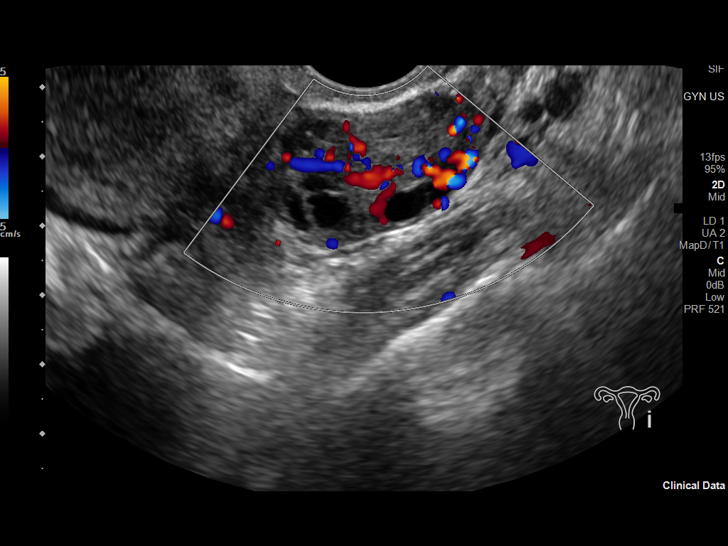
[im 80/107]
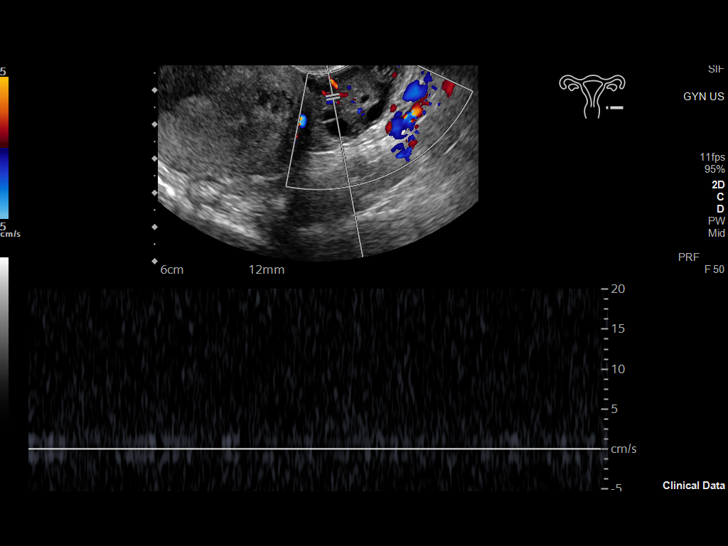
[im 89/107]
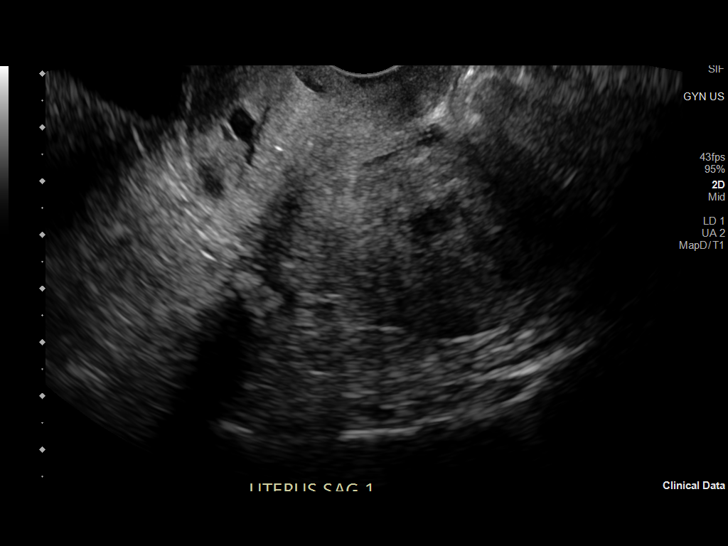
[im 98/107]
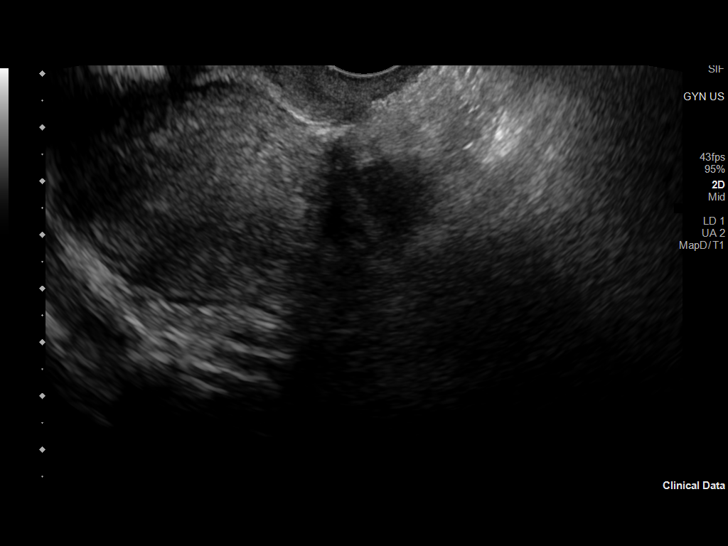
[im 107/107]
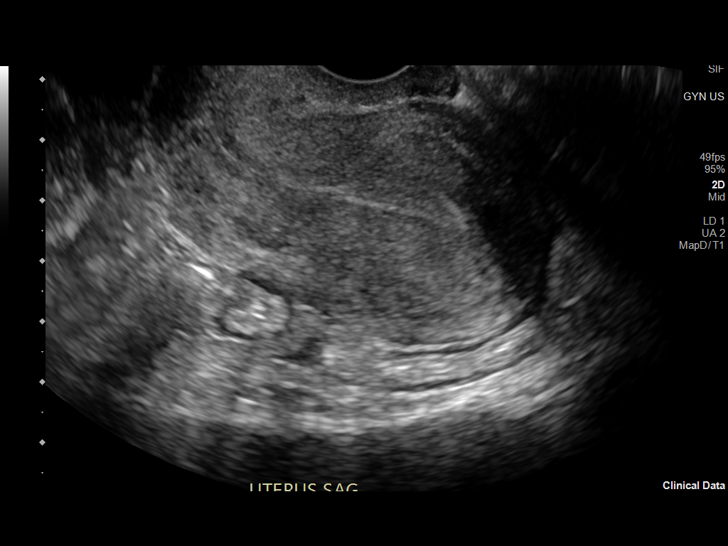

[13 of 25 positions shown; findings below may reference images not displayed]

FINDINGS: Uterus

Measurements: 7.9 x 4.3 x 5.1 cm = volume: 89 mL. Small left
posterior fibroid, 1 cm.

Endometrium

Thickness: 4 mm in thickness.  No focal abnormality visualized.

Right ovary

Measurements: 3.2 x 1.9 x 2.4 cm = volume: 7.7 mL. Normal
appearance/no adnexal mass. Dominant 1.6 cm follicle.

Left ovary

Measurements: 3.3 x 1.6 x 2.5 cm = volume: 6.9 mL. Normal
appearance/no adnexal mass.

Pulsed Doppler evaluation of both ovaries demonstrates normal
low-resistance arterial and venous waveforms.

Other findings

Small amount of free fluid.
IMPRESSION: No acute findings.

No evidence of ovarian torsion.

## 2024-01-13 ENCOUNTER — Other Ambulatory Visit: Payer: Self-pay

## 2024-01-13 ENCOUNTER — Encounter (HOSPITAL_COMMUNITY): Payer: Self-pay | Admitting: *Deleted

## 2024-01-13 ENCOUNTER — Ambulatory Visit (HOSPITAL_COMMUNITY)
Admission: EM | Admit: 2024-01-13 | Discharge: 2024-01-13 | Disposition: A | Discharge status: Home / Self Care | Payer: Self-pay | Attending: Emergency Medicine | Admitting: Emergency Medicine

## 2024-01-13 DIAGNOSIS — N611 Abscess of the breast and nipple: Secondary | ICD-10-CM

## 2024-01-13 MED ORDER — DICLOXACILLIN SODIUM 500 MG PO CAPS: 500.0000 mg | ORAL_CAPSULE | Freq: Four times a day (QID) | ORAL | 0 refills | Status: AC

## 2024-01-13 NOTE — ED Triage Notes (Signed)
 PT DOB and full name confirmed.

## 2024-01-13 NOTE — ED Provider Notes (Signed)
 [facility]    CSN: [id number] Arrival date & time: 01/13/24  1914      History   Chief Complaint Chief Complaint  Patient presents with   Breast Pain    HPI Molly Beard is a 38 y.o. female.   Patient presents to clinic over concerns of a painful, erythematous and indurated area to the underside of her right breast.  The pain and throbbing sensation woke her up around 4 AM this morning.  Was not having any issues with the breast prior to this.  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  Has never had a breast abscess before.  Is not breast-feeding.  She did take ibuprofen  prior to coming to clinic and reports the redness and some of the pain is improved, the painful knot remains.  The history is provided by the patient and medical records.    Past Medical History:  Diagnosis Date   DDD (degenerative disc disease), lumbar    Gallstone pancreatitis    GERD (gastroesophageal reflux disease)     Patient Active Problem List   Diagnosis Date Noted   Gallstone pancreatitis 07/17/2015   Stuttering 08/25/2012   Syncope and collapse 08/25/2012   Diplopia 08/25/2012   Lower urinary tract infectious disease 08/25/2012    Past Surgical History:  Procedure Laterality Date   CHOLECYSTECTOMY N/A 07/19/2015   Procedure: LAPAROSCOPIC CHOLECYSTECTOMY WITH INTRAOPERATIVE CHOLANGIOGRAM;  Surgeon: Aldean Hummingbird, MD;  Location: [facility] OR;  Service: General;  Laterality: N/A;   TUBAL LIGATION  2010   TUBAL LIGATION      OB History   No obstetric history on file.      Home Medications    Prior to Admission medications   Medication Sig Start Date End Date Taking? Authorizing Provider  dicloxacillin (DYNAPEN) 500 MG capsule Take 1 capsule (500 mg total) by mouth 4 (four) times daily for 10 days. 01/13/24 01/23/24 Yes Lawonda Pretlow  N, FNP  ibuprofen  (ADVIL ) 200 MG tablet Take 200-800 mg by mouth every 6 (six) hours as needed (for pain).    Yes [provider]  acetaminophen  (TYLENOL ) 500 MG tablet Take 1,500 mg by mouth 3 (three) times daily as needed (for pain).    [provider]    Family History Family History  Problem Relation Age of Onset   Healthy Mother    Healthy Father     Social History Social History   Tobacco Use   Smoking status: Every Day    Current packs/day: 0.12    Types: Cigarettes   Smokeless tobacco: Never  Substance Use Topics   Alcohol use: No   Drug use: No     Allergies   Patient has no known allergies.   Review of Systems Review of Systems  Per HPI  Physical Exam Triage Vital Signs ED Triage Vitals  Encounter Vitals Group     BP 01/13/24 0902 108/88     Systolic BP Percentile --      Diastolic BP Percentile --      Pulse Rate 01/13/24 0902 70     Resp 01/13/24 0902 18     Temp 01/13/24 0902 97.9 F (36.6 C)     Temp src --      SpO2 01/13/24 0902 98 %     Weight --      Height --      Head Circumference --      Peak Flow --  Pain Score 01/13/24 0901 6     Pain Loc --      Pain Education --      Exclude from Growth Chart --    No data found.  Updated Vital Signs BP 108/88   Pulse 70   Temp 97.9 F (36.6 C)   Resp 18   LMP 12/30/2023   SpO2 98%   Visual Acuity Right Eye Distance:   Left Eye Distance:   Bilateral Distance:    Right Eye Near:   Left Eye Near:    Bilateral Near:     Physical Exam Vitals and nursing note reviewed.  Constitutional:      Appearance: Normal appearance.  HENT:     Head: Normocephalic.     Right Ear: External ear normal.     Left Ear: External ear normal.     Nose: Nose normal.     Mouth/Throat:     Mouth: Mucous membranes are moist.  Eyes:     Conjunctiva/sclera: Conjunctivae normal.  Cardiovascular:     Rate and Rhythm: Normal rate.  Pulmonary:     Effort: Pulmonary effort is normal. No respiratory distress.  Chest:    Skin:    General: Skin is warm and dry.     Findings: Erythema  present.  Neurological:     General: No focal deficit present.     Mental Status: She is alert.  Psychiatric:        Mood and Affect: Mood normal.        Behavior: Behavior is cooperative.      UC Treatments / Results  Labs (all labs ordered are listed, but only abnormal results are displayed) Labs Reviewed - No data to display  EKG   Radiology No results found.  Procedures Procedures (including critical care time)  Medications Ordered in UC Medications - No data to display  Initial Impression / Assessment and Plan / UC Course  I have reviewed the triage vital signs and the nursing notes.  Pertinent labs & imaging results that were available during my care of the patient were reviewed by me and considered in my medical decision making (see chart for details).  Vitals in triage reviewed, patient is hemodynamically stable.  Indurated deep abscess to the right breast, approximately 1 cm x 1 cm.  Overlying skin changes with warmth and erythema.  Will cover for primary breast abscess with dicloxacillin.  [place] follow-up encouraged if no improvement.  Pain management discussed.  Plan of care, follow-up care return precautions given, no questions at this time.     Final Clinical Impressions(s) / UC Diagnoses   Final diagnoses:  Breast abscess of female     Discharge Instructions      Take the antibiotics 4 times daily for the next 10 days.  Take these with food.  You can take 800 mg of ibuprofen  every 8 hours to help with pain and inflammation.  Warm compresses may help as well.  If you do not have any improvement on antibiotics for the next 72 hours please follow-up with [place] surgery. I advise against tight fitting clothing as this may make your pain worse.   Return to clinic for new or urgent symptoms.    ED Prescriptions     Medication Sig Dispense Auth. Provider   dicloxacillin (DYNAPEN) 500 MG capsule Take 1 capsule (500 mg total) by mouth 4 (four)  times daily for 10 days. 40 capsule Harlow Lighter, Gerturde Kuba  N, FNP  PDMP not reviewed this encounter.   Harlow Lighter, Helaine Yackel  N, FNP 01/13/24 [id number]

## 2024-01-13 NOTE — Discharge Instructions (Addendum)
 Take the antibiotics 4 times daily for the next 10 days.  Take these with food.  You can take 800 mg of ibuprofen  every 8 hours to help with pain and inflammation.  Warm compresses may help as well.  If you do not have any improvement on antibiotics for the next 72 hours please follow-up with  surgery. I advise against tight fitting clothing as this may make your pain worse.   Return to clinic for new or urgent symptoms.

## 2024-01-13 NOTE — ED Triage Notes (Signed)
 PT reports RT breast pain with a bump and skin was red this AM.
